# Patient Record
Sex: Female | Born: 1955 | Hispanic: No | Smoking: Former smoker
Health system: Southern US, Community
[De-identification: ages and names within clinical notes are randomized; demographics above are authoritative.]

## PROBLEM LIST (undated history)

## (undated) DIAGNOSIS — I4891 Unspecified atrial fibrillation: Secondary | ICD-10-CM

## (undated) DIAGNOSIS — E119 Type 2 diabetes mellitus without complications: Secondary | ICD-10-CM

## (undated) DIAGNOSIS — I509 Heart failure, unspecified: Secondary | ICD-10-CM

## (undated) DIAGNOSIS — I10 Essential (primary) hypertension: Secondary | ICD-10-CM

## (undated) DIAGNOSIS — E785 Hyperlipidemia, unspecified: Secondary | ICD-10-CM

## (undated) DIAGNOSIS — E669 Obesity, unspecified: Secondary | ICD-10-CM

## (undated) DIAGNOSIS — Z86718 Personal history of other venous thrombosis and embolism: Secondary | ICD-10-CM

## (undated) HISTORY — DX: Obesity, unspecified: E66.9

## (undated) HISTORY — DX: Unspecified atrial fibrillation: I48.91

## (undated) HISTORY — DX: Hyperlipidemia, unspecified: E78.5

## (undated) HISTORY — PX: TONSILLECTOMY: SUR1361

## (undated) HISTORY — DX: Essential (primary) hypertension: I10

## (undated) HISTORY — DX: Type 2 diabetes mellitus without complications: E11.9

## (undated) HISTORY — DX: Heart failure, unspecified: I50.9

## (undated) HISTORY — DX: Personal history of other venous thrombosis and embolism: Z86.718

---

## 1977-04-20 HISTORY — PX: APPENDECTOMY: SHX54

## 2001-05-27 ENCOUNTER — Encounter: Payer: Self-pay | Admitting: *Deleted

## 2001-05-27 ENCOUNTER — Ambulatory Visit (HOSPITAL_COMMUNITY): Admission: RE | Admit: 2001-05-27 | Discharge: 2001-05-27 | Payer: Self-pay | Admitting: *Deleted

## 2003-04-21 HISTORY — PX: HERNIA REPAIR: SHX51

## 2005-04-20 DIAGNOSIS — Z86718 Personal history of other venous thrombosis and embolism: Secondary | ICD-10-CM

## 2005-04-20 HISTORY — DX: Personal history of other venous thrombosis and embolism: Z86.718

## 2006-04-20 HISTORY — PX: CARDIAC DEFIBRILLATOR PLACEMENT: SHX171

## 2010-05-06 ENCOUNTER — Ambulatory Visit (HOSPITAL_COMMUNITY)
Admission: RE | Admit: 2010-05-06 | Discharge: 2010-05-06 | Payer: Self-pay | Source: Home / Self Care | Attending: Family Medicine | Admitting: Family Medicine

## 2010-07-07 ENCOUNTER — Ambulatory Visit (HOSPITAL_COMMUNITY)
Admission: RE | Admit: 2010-07-07 | Discharge: 2010-07-07 | Disposition: A | Discharge status: Home / Self Care | Payer: Self-pay | Source: Ambulatory Visit | Attending: Family Medicine | Admitting: Family Medicine

## 2010-07-07 ENCOUNTER — Other Ambulatory Visit (HOSPITAL_COMMUNITY): Payer: Self-pay | Admitting: Family Medicine

## 2010-07-07 DIAGNOSIS — R109 Unspecified abdominal pain: Secondary | ICD-10-CM | POA: Insufficient documentation

## 2010-07-07 DIAGNOSIS — R52 Pain, unspecified: Secondary | ICD-10-CM

## 2010-07-07 DIAGNOSIS — K5900 Constipation, unspecified: Secondary | ICD-10-CM | POA: Insufficient documentation

## 2010-07-07 DIAGNOSIS — R110 Nausea: Secondary | ICD-10-CM | POA: Insufficient documentation

## 2010-07-17 ENCOUNTER — Encounter (INDEPENDENT_AMBULATORY_CARE_PROVIDER_SITE_OTHER): Payer: Self-pay | Admitting: *Deleted

## 2010-08-26 ENCOUNTER — Encounter: Payer: Self-pay | Admitting: Gastroenterology

## 2010-08-26 ENCOUNTER — Ambulatory Visit (INDEPENDENT_AMBULATORY_CARE_PROVIDER_SITE_OTHER): Payer: Self-pay | Admitting: Gastroenterology

## 2010-08-26 ENCOUNTER — Encounter: Payer: Self-pay | Admitting: *Deleted

## 2010-08-26 DIAGNOSIS — E119 Type 2 diabetes mellitus without complications: Secondary | ICD-10-CM

## 2010-08-26 DIAGNOSIS — R198 Other specified symptoms and signs involving the digestive system and abdomen: Secondary | ICD-10-CM

## 2010-08-26 DIAGNOSIS — R109 Unspecified abdominal pain: Secondary | ICD-10-CM

## 2010-08-26 MED ORDER — PEG-KCL-NACL-NASULF-NA ASC-C 100 G PO SOLR: 1.0000 | Freq: Once | ORAL | Status: DC

## 2010-08-26 MED ORDER — PEG-KCL-NACL-NASULF-NA ASC-C 100 G PO SOLR: 1.0000 | Freq: Once | ORAL | Status: AC

## 2010-08-26 NOTE — Progress Notes (Signed)
History of Present Illness:  This is a very nice but somewhat complicated 55 year old African American female referred by Dr. Clyde Canterbury. Wilson for evaluation of 8 weeks of rather acute onset constipation with vague lower abdominal discomfort, gas, bloating, excessive flatus. Lab data has been unremarkable as has recent abdominal ultrasound. Patient today is accompanied by Dr. Redmond Pulling during the exam and interview. She specifically denies melena, hematochezia, upper GI or hepatobiliary complaints. He has insulin-dependent diabetes for 15 years with recent hemoglobin A1c of approximately 9. She hs multiple cardiovascular issues complicated by what sounds like a DVT and possible pulmonary embolus in 2007. Subsequently. she had cardiac arrhythmias, and apparently has had a  defibrillator placed. She currently is on Coumadin therapy. Other problems include hypertension and hyperlipidemia. She has chronic obesity but has lost over 100 pounds in weight over the last year voluntarily. Previous surgeries include appendectomy in 1979. She has had 3 cesarean sections. She is on split doses of insulin, daily metformin, digoxin, Coreg, and Lipitor. Family history is noncontributory. The patient also takes daily fiber supplements. She denies complications from her diabetes such as visual difficulties, peripheral neuropathy, or kidney problems. She Is Followed by [facility] for her cardiovascular problems. Currently the patient has a bowel movement every 3 days despite laxative use.  I have reviewed this patient's present history, medical and surgical past history, allergies and medications.     ROS: The remainder of the 10 point ROS is negative     Physical Exam: General well developed well nourished patient in no acute distress, appearing her stated age Eyes PERRLA, no icterus, fundoscopic exam per opthamologist Skin no lesions noted Neck supple, no adenopathy, no thyroid enlargement, no tenderness Chest  clear to percussion and auscultation Heart no significant murmurs, gallops or rubs noted Abdomen no hepatosplenomegaly masses or tenderness, BS normal. Veryloud and active bowel sounds are noted. She has a well healed suprapubic scar with a 8-9 cm mid abdominal mass which is freely movable in the midabdomen. This is nontender and not fluctuant. Extremities no acute joint lesions, edema, phlebitis or evidence of cellulitis. Neurologic patient oriented x 3, cranial nerves intact, no focal neurologic deficits noted. Psychological mental status normal and normal affect.  Assessment and plan: Acute onset constipation and a 55 year old female with insulin-dependent diabetes. She has an abnormal abdominal exam, suspect she has some adhesions with palpable bowel loops in the lower abdomen, but colonoscopy is indicated to exclude colonic polyposis or partial colonic obstruction. Decided to perform this procedure on Coumadin with close monitoring/sedation. She may have an element of bacterial overgrowth syndrome. Screening laboratory test also ordered at this time. We'll make adjustments in her diabetic medications for procedure.  Encounter Diagnoses  Name Primary?  . Insulin dependent diabetes mellitus   . Change in bowel function   . Abdominal pain

## 2010-08-26 NOTE — Patient Instructions (Signed)
Your procedure has been scheduled for 08/27/2010, please follow the seperate instructions.  Your prescription(s) have been sent to you pharmacy.

## 2010-08-27 ENCOUNTER — Encounter: Payer: Self-pay | Admitting: Gastroenterology

## 2010-08-27 ENCOUNTER — Ambulatory Visit (AMBULATORY_SURGERY_CENTER): Payer: Self-pay | Admitting: Gastroenterology

## 2010-08-27 VITALS — BP 135/71 | HR 65 | Temp 97.4°F | Resp 19 | Ht 64.0 in | Wt 264.0 lb

## 2010-08-27 DIAGNOSIS — K5900 Constipation, unspecified: Secondary | ICD-10-CM

## 2010-08-27 DIAGNOSIS — R1084 Generalized abdominal pain: Secondary | ICD-10-CM

## 2010-08-27 DIAGNOSIS — R109 Unspecified abdominal pain: Secondary | ICD-10-CM

## 2010-08-27 DIAGNOSIS — K5904 Chronic idiopathic constipation: Secondary | ICD-10-CM

## 2010-08-27 LAB — GLUCOSE, CAPILLARY: Glucose-Capillary: 220 mg/dL — ABNORMAL HIGH (ref 70–99)

## 2010-08-27 MED ORDER — SODIUM CHLORIDE 0.9 % IV SOLN: 500.0000 mL | INTRAVENOUS | Status: DC

## 2010-08-27 NOTE — Patient Instructions (Signed)
Normal colonoscopy today. Repeat colonoscopy in 10 years. Please read over discharge handouts also handout given on high fiber diet.  Continue current medications. Blood sugar at discharge=220. Start Miralax over the counter laxative 1 tablespoon nightly in 8oz liquid at directed.  Samples given today for Align take 1 tablet daily.

## 2010-08-28 ENCOUNTER — Telehealth: Payer: Self-pay | Admitting: *Deleted

## 2010-08-28 NOTE — Telephone Encounter (Signed)

## 2010-09-05 NOTE — Cardiovascular Report (Signed)
[place]  Patient:    Tonya Evans, Tonya Evans Visit Number: [id number] MRN: [id number]          Service Type: CAT Location: [facility] 2854 [id number] Attending Physician:  Allene Dillon Dictated by:   Allene Dillon, M.D. [facility] Proc. Date: 05/27/01 Admit Date:  05/27/2001 Discharge Date: 05/27/2001   CC:         Franklyn Lor, M.D.  Carlena Bjornstad, M.D. [facility]  Cardiac Catheterization Laboratory   Cardiac Catheterization  PROCEDURES PERFORMED: Left heart catheterization with coronary angiography and left ventriculography.  INDICATIONS: The patient is a 55 year old woman with a history of diabetes and hypertension. She had a recent episode of congestive heart failure. She was referred for cardiac catheterization to evaluate the etiology of her congestive heart failure.  DESCRIPTION OF PROCEDURE: A 6 French sheath was placed in the right femoral artery.  Standard Judkins 6 French catheters were utilized.  Contrast was Omnipaque.  There were no complications.  RESULTS:  HEMODYNAMICS: Left ventricular pressure 150/30, aortic pressure 150/78.  There was no aortic valve gradient.  LEFT VENTRICULOGRAM: There is moderate global hypokinesis of the left ventricle.  Ejection fraction is calculated at 42%.  There is trace mitral regurgitation.  CORONARY ARTERIOGRAPHY: (Right dominant).  Left main: Normal.  Left anterior descending: The left anterior descending artery gives rise to a small diagonal branch. The LAD is normal.  Left circumflex: The left circumflex gives rise to a large OM-1 and a normal sized OM-2. The left circumflex is normal.  Right coronary artery: The right coronary artery gives rise to a small posterior descending artery and two posterolateral branches. The right coronary artery is normal.  IMPRESSIONS: 1. Moderately decreased left ventricular systolic function secondary to a    nonischemic cardiomyopathy. 2. Normal coronary arteries by  angiography. Dictated by:   Allene Dillon, M.D. [place] Attending Physician:  Allene Dillon DD:  05/27/01 TD:  05/28/01 Job: [id number]

## 2010-10-13 NOTE — Letter (Signed)
Summary: New Patient letter  [facility]  [address]   [place], [place] [address]   Phone: [phone]  Fax: [phone]       07/17/2010 MRN: [id number]  [address] [place], [place]  [address]  [place]  Dear Ms. [place],  Welcome to the Gastroenterology Division at [facility].    You are scheduled to see Dr.  Sharlett Iles on 08/26/2010 at 10:00 on the 3rd floor at [facility], [place] [employer].  We ask that you try to arrive at our office 15 minutes prior to your appointment time to allow for check-in.  We would like you to complete the enclosed self-administered evaluation form prior to your visit and bring it with you on the day of your appointment.  We will review it with you.  Also, please bring a complete list of all your medications or, if you prefer, bring the medication bottles and we will list them.  Please bring your insurance card so that we may make a copy of it.  If your insurance requires a referral to see a specialist, please bring your referral form from your primary care physician.  Co-payments are due at the time of your visit and may be paid by cash, check or credit card.     Your office visit will consist of a consult with your physician (includes a physical exam), any laboratory testing he/she may order, scheduling of any necessary diagnostic testing (e.g. x-ray, ultrasound, CT-scan), and scheduling of a procedure (e.g. Endoscopy, Colonoscopy) if required.  Please allow enough time on your schedule to allow for any/all of these possibilities.    If you cannot keep your appointment, please call [phone] to cancel or reschedule prior to your appointment date.  This allows [place] the opportunity to schedule an appointment for another patient in need of care.  If you do not cancel or reschedule by 5 p.m. the business day prior to your appointment date, you will be charged a $50.00 late cancellation/no-show fee.    Thank you for choosing  [facility] for your medical needs.  We appreciate the opportunity to care for you.  Please visit [place] at our website  to learn more about our practice.                     Sincerely,                                                             The Gastroenterology Division

## 2011-01-29 ENCOUNTER — Other Ambulatory Visit (HOSPITAL_COMMUNITY): Payer: Self-pay | Admitting: Family Medicine

## 2011-01-29 ENCOUNTER — Ambulatory Visit (HOSPITAL_COMMUNITY)
Admission: RE | Admit: 2011-01-29 | Discharge: 2011-01-29 | Disposition: A | Discharge status: Home / Self Care | Payer: Self-pay | Source: Ambulatory Visit | Attending: Family Medicine | Admitting: Family Medicine

## 2011-01-29 DIAGNOSIS — M25473 Effusion, unspecified ankle: Secondary | ICD-10-CM | POA: Insufficient documentation

## 2011-01-29 DIAGNOSIS — M25476 Effusion, unspecified foot: Secondary | ICD-10-CM | POA: Insufficient documentation

## 2011-01-29 DIAGNOSIS — M79609 Pain in unspecified limb: Secondary | ICD-10-CM | POA: Insufficient documentation

## 2011-01-29 DIAGNOSIS — M1710 Unilateral primary osteoarthritis, unspecified knee: Secondary | ICD-10-CM | POA: Insufficient documentation

## 2011-01-29 DIAGNOSIS — M25579 Pain in unspecified ankle and joints of unspecified foot: Secondary | ICD-10-CM | POA: Insufficient documentation

## 2011-01-29 DIAGNOSIS — W19XXXA Unspecified fall, initial encounter: Secondary | ICD-10-CM

## 2011-01-29 DIAGNOSIS — M25539 Pain in unspecified wrist: Secondary | ICD-10-CM | POA: Insufficient documentation

## 2012-11-18 IMAGING — CR DG FOOT COMPLETE 3+V*R*
3 series · 3 of 3 positions shown · non-contrast
Comparison: None.

CLINICAL DATA: Right foot and ankle pain and swelling secondary to
a fall.

RIGHT FOOT COMPLETE - 3+ VIEW

[t foot ap right]
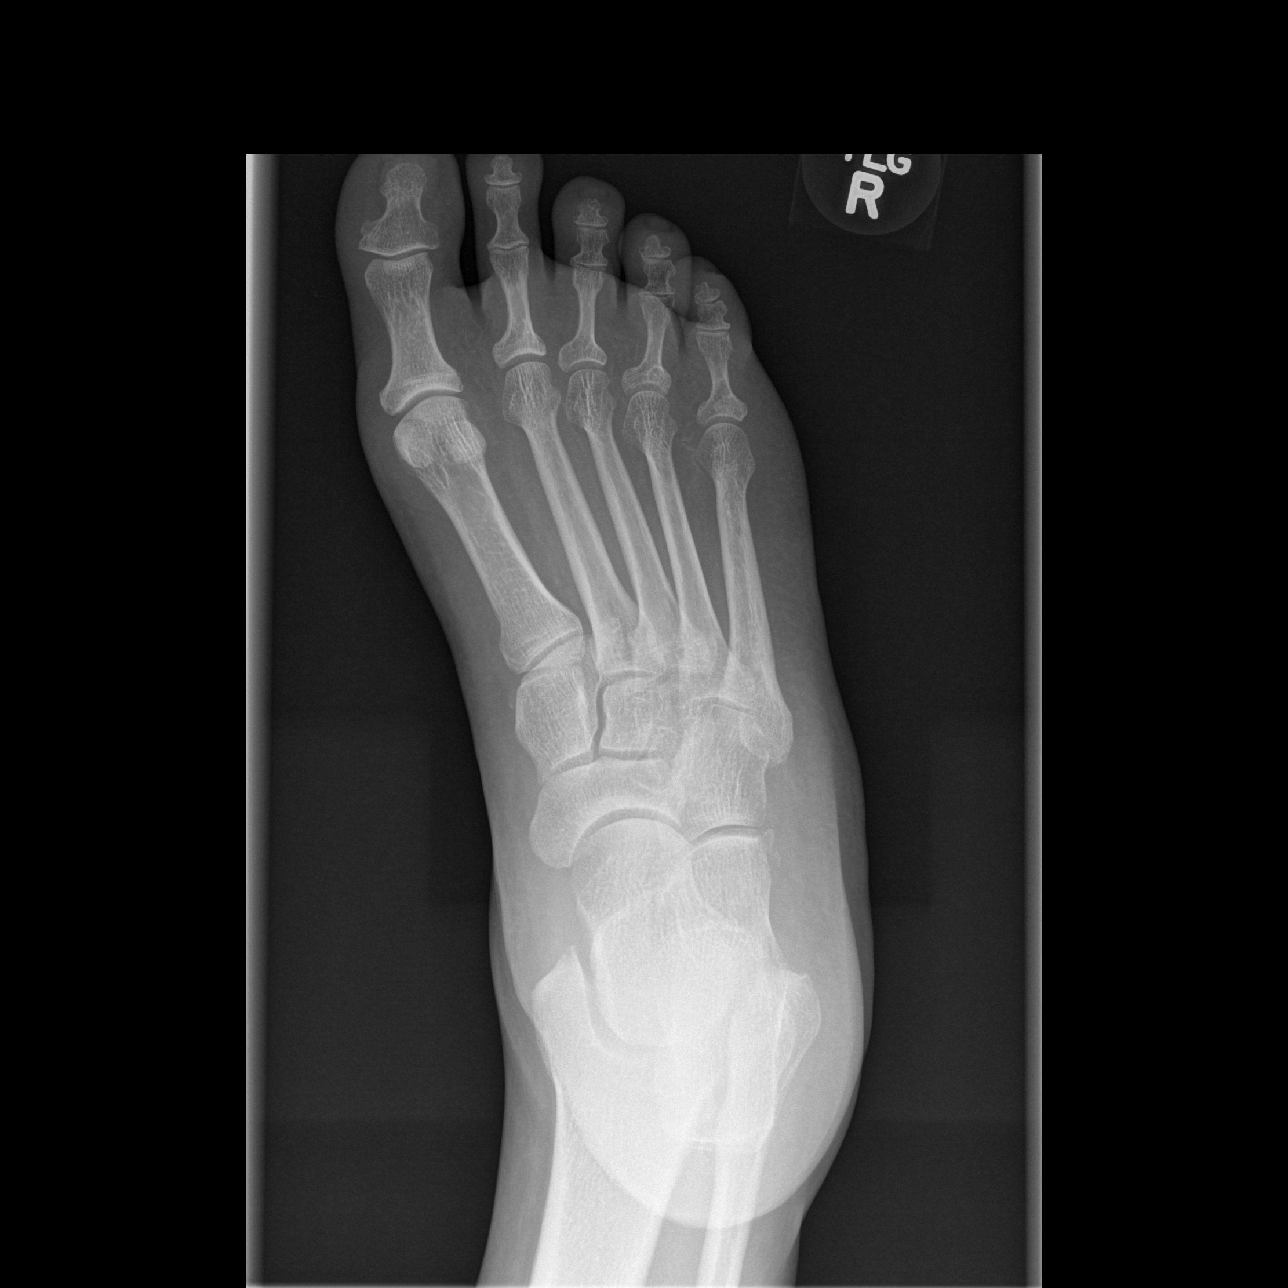

[t foot oblique right]
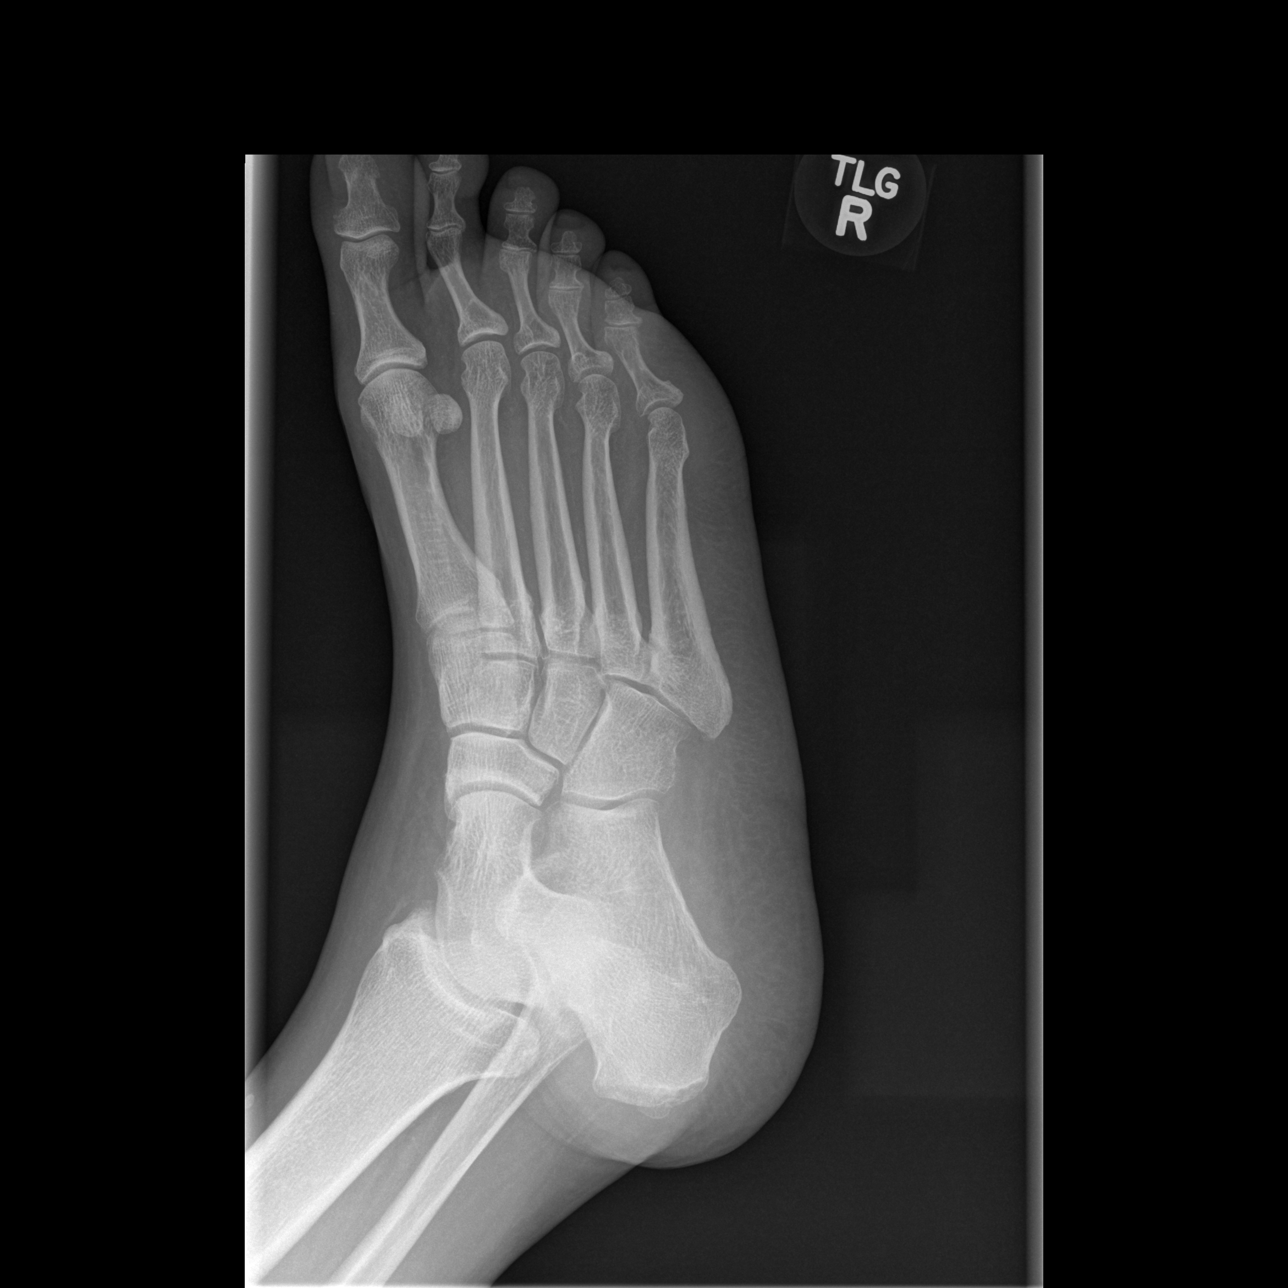

[t foot lat right]
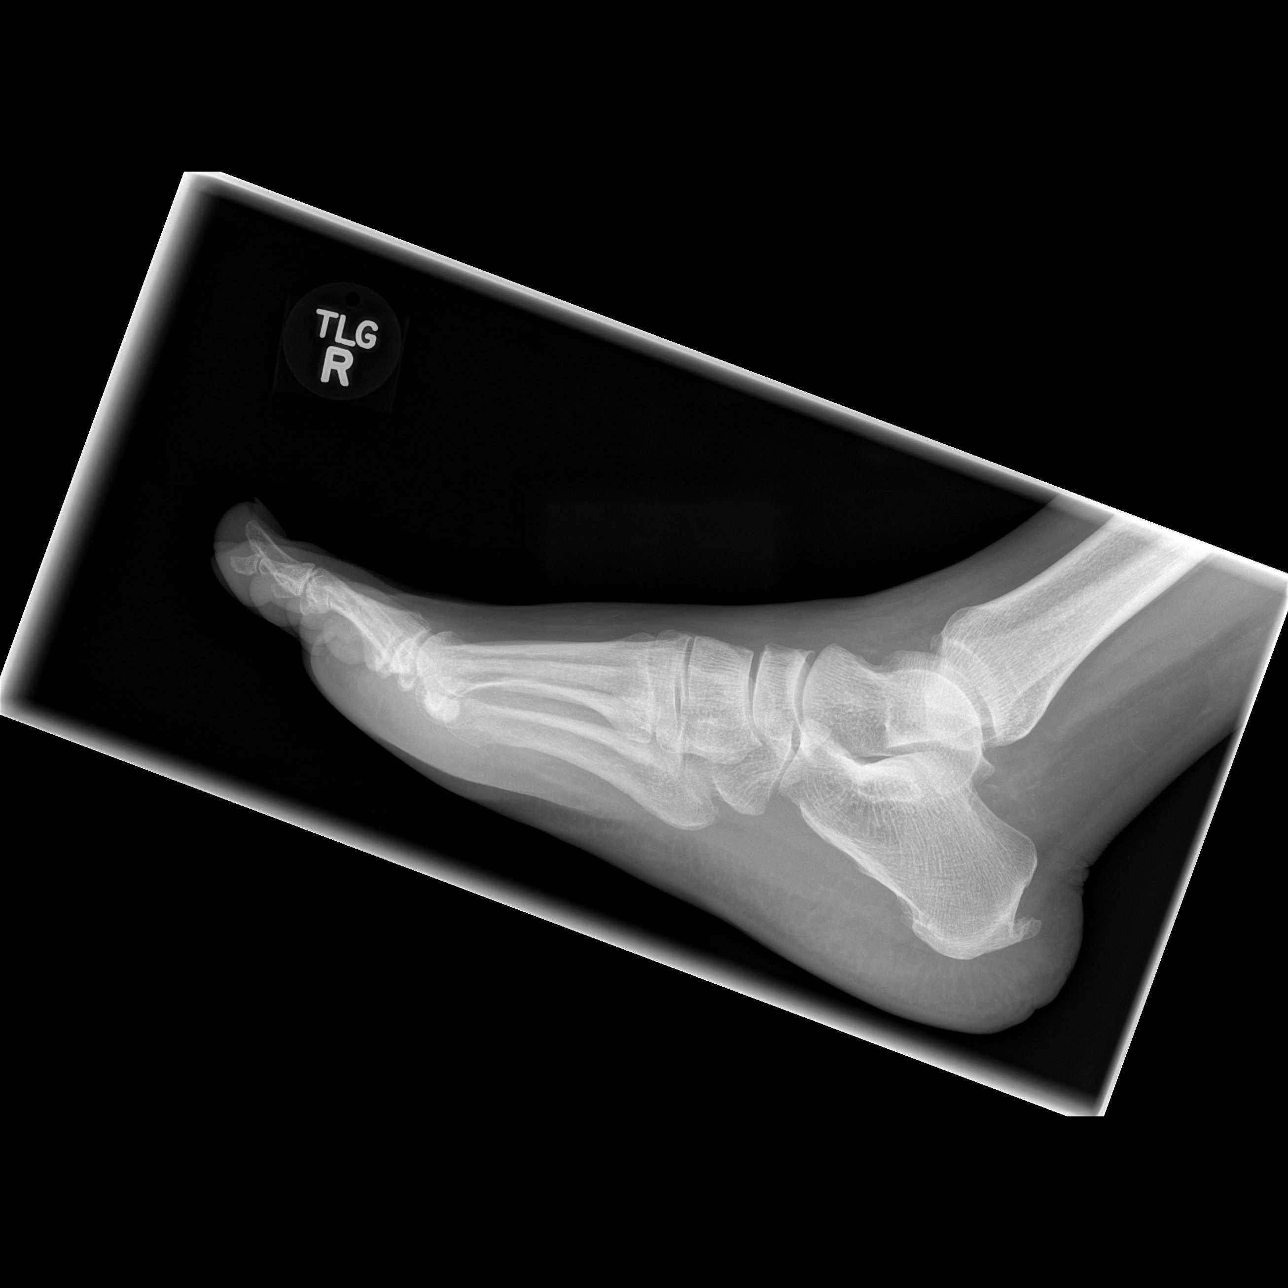

[3 of 3 positions shown; findings below may reference images not displayed]

FINDINGS: There is no fracture, dislocation, or significant
arthritic change.
IMPRESSION: Normal right foot.

## 2013-09-06 DIAGNOSIS — E669 Obesity, unspecified: Secondary | ICD-10-CM | POA: Insufficient documentation

## 2013-09-06 DIAGNOSIS — Z95810 Presence of automatic (implantable) cardiac defibrillator: Secondary | ICD-10-CM | POA: Insufficient documentation

## 2014-05-22 ENCOUNTER — Ambulatory Visit: Payer: Self-pay

## 2014-05-25 DIAGNOSIS — Z789 Other specified health status: Secondary | ICD-10-CM | POA: Insufficient documentation

## 2014-05-25 DIAGNOSIS — I824Y9 Acute embolism and thrombosis of unspecified deep veins of unspecified proximal lower extremity: Secondary | ICD-10-CM | POA: Insufficient documentation

## 2014-05-29 ENCOUNTER — Ambulatory Visit: Payer: Self-pay

## 2015-02-04 DIAGNOSIS — I429 Cardiomyopathy, unspecified: Secondary | ICD-10-CM | POA: Insufficient documentation

## 2017-09-08 DIAGNOSIS — R0689 Other abnormalities of breathing: Secondary | ICD-10-CM | POA: Insufficient documentation

## 2017-09-08 DIAGNOSIS — Z7901 Long term (current) use of anticoagulants: Secondary | ICD-10-CM | POA: Insufficient documentation

## 2017-09-08 DIAGNOSIS — R0600 Dyspnea, unspecified: Secondary | ICD-10-CM | POA: Insufficient documentation

## 2017-09-08 DIAGNOSIS — I509 Heart failure, unspecified: Secondary | ICD-10-CM | POA: Insufficient documentation

## 2017-09-08 DIAGNOSIS — I480 Paroxysmal atrial fibrillation: Secondary | ICD-10-CM | POA: Insufficient documentation

## 2017-09-08 DIAGNOSIS — E1165 Type 2 diabetes mellitus with hyperglycemia: Secondary | ICD-10-CM | POA: Insufficient documentation

## 2017-09-08 DIAGNOSIS — I10 Essential (primary) hypertension: Secondary | ICD-10-CM | POA: Insufficient documentation

## 2017-09-08 DIAGNOSIS — J4530 Mild persistent asthma, uncomplicated: Secondary | ICD-10-CM | POA: Insufficient documentation

## 2017-09-08 DIAGNOSIS — J45909 Unspecified asthma, uncomplicated: Secondary | ICD-10-CM | POA: Insufficient documentation

## 2017-12-29 DIAGNOSIS — R42 Dizziness and giddiness: Secondary | ICD-10-CM | POA: Insufficient documentation

## 2017-12-31 DIAGNOSIS — H8110 Benign paroxysmal vertigo, unspecified ear: Secondary | ICD-10-CM | POA: Insufficient documentation

## 2018-06-14 DIAGNOSIS — Z86718 Personal history of other venous thrombosis and embolism: Secondary | ICD-10-CM | POA: Insufficient documentation

## 2019-03-03 DIAGNOSIS — R778 Other specified abnormalities of plasma proteins: Secondary | ICD-10-CM | POA: Insufficient documentation

## 2019-03-03 DIAGNOSIS — E785 Hyperlipidemia, unspecified: Secondary | ICD-10-CM | POA: Insufficient documentation

## 2020-03-07 DIAGNOSIS — I2510 Atherosclerotic heart disease of native coronary artery without angina pectoris: Secondary | ICD-10-CM | POA: Insufficient documentation

## 2020-12-09 ENCOUNTER — Encounter: Payer: Self-pay | Admitting: Gastroenterology

## 2020-12-20 ENCOUNTER — Encounter: Payer: Self-pay | Admitting: *Deleted

## 2020-12-20 ENCOUNTER — Encounter: Payer: Self-pay | Admitting: Family Medicine

## 2020-12-20 ENCOUNTER — Other Ambulatory Visit: Payer: Self-pay

## 2020-12-20 ENCOUNTER — Ambulatory Visit (INDEPENDENT_AMBULATORY_CARE_PROVIDER_SITE_OTHER): Payer: HMO | Admitting: Family Medicine

## 2020-12-20 VITALS — BP 109/70 | HR 90 | Temp 97.1°F | Resp 16 | Ht 65.5 in | Wt 286.0 lb

## 2020-12-20 DIAGNOSIS — Z7689 Persons encountering health services in other specified circumstances: Secondary | ICD-10-CM

## 2020-12-20 DIAGNOSIS — I10 Essential (primary) hypertension: Secondary | ICD-10-CM | POA: Diagnosis not present

## 2020-12-20 DIAGNOSIS — I4891 Unspecified atrial fibrillation: Secondary | ICD-10-CM | POA: Insufficient documentation

## 2020-12-20 DIAGNOSIS — E1169 Type 2 diabetes mellitus with other specified complication: Secondary | ICD-10-CM

## 2020-12-20 DIAGNOSIS — Z6841 Body Mass Index (BMI) 40.0 and over, adult: Secondary | ICD-10-CM

## 2020-12-20 LAB — POCT GLYCOSYLATED HEMOGLOBIN (HGB A1C): Hemoglobin A1C: 9 % — AB (ref 4.0–5.6)

## 2020-12-20 MED ORDER — FREESTYLE LIBRE 2 SENSOR MISC: 3 refills | Status: DC

## 2020-12-20 NOTE — Progress Notes (Signed)
New Patient Office Visit  Subjective:  Patient ID: Tonya Evans, female    DOB: 04/30/1955  Age: 65 y.o. MRN: [id number]  CC:  Chief Complaint  Patient presents with   Establish Care    HPI [name] presents for to establish care.  Patient has had to change provider location secondary to insurance.  Patient desires to follow-up for chronic medical problems including hypertension diabetes and obesity.  Patient denies acute complaints or concerns.  Past Medical History:  Diagnosis Date   A-fib ([place])    CHF (congestive heart failure) (HCC)    Constipation    History of blood clots 04/20/2005   Hyperlipemia    Hypertension    Obesity    Type II or unspecified type diabetes mellitus without mention of complication, not stated as uncontrolled     Past Surgical History:  Procedure Laterality Date   APPENDECTOMY  1979   CARDIAC DEFIBRILLATOR PLACEMENT  2008   CESAREAN SECTION     x 3    HERNIA REPAIR  2005   TONSILLECTOMY      Family History  Problem Relation Age of Onset   Diabetes Father    Hypertension Mother    Diabetes Brother    Hypertension Sister    Breast cancer Mother    Colon cancer Neg Hx     Social History   Socioeconomic History   Marital status: Legally Separated    Spouse name: Not on file   Number of children: Not on file   Years of education: Not on file   Highest education level: Not on file  Occupational History   Occupation: pastor    Employer: [employer]  Tobacco Use   Smoking status: Former   Smokeless tobacco: Never  Substance and Sexual Activity   Alcohol use: No   Drug use: No   Sexual activity: Not on file  Other Topics Concern   Not on file  Social History Narrative   No caffeine drinks    Social Determinants of Radio broadcast assistant Strain: Not on file  Food Insecurity: Not on file  Transportation Needs: Not on file  Physical Activity: Not on file  Stress: Not on file  Social Connections: Not on file   Intimate Partner Violence: Not on file    ROS Review of Systems  All other systems reviewed and are negative.  Objective:   Today's Vitals: BP 109/70 (BP Location: Right Arm, Patient Position: Sitting, Cuff Size: Large)   Pulse 90   Temp (!) 97.1 F (36.2 C) (Temporal)   Resp 16   Ht 5' 5.5" (1.664 m)   Wt 286 lb (129.7 kg)   SpO2 96%   BMI 46.87 kg/m   Physical Exam Vitals and nursing note reviewed.  Constitutional:      General: She is not in acute distress.    Appearance: She is obese.  Cardiovascular:     Rate and Rhythm: Normal rate and regular rhythm.     Heart sounds: Normal heart sounds.  Pulmonary:     Effort: Pulmonary effort is normal.     Breath sounds: Normal breath sounds.  Abdominal:     Palpations: Abdomen is soft.     Tenderness: There is no abdominal tenderness.  Musculoskeletal:     Right lower leg: Edema present.     Left lower leg: Edema present.  Neurological:     General: No focal deficit present.     Mental Status:  She is alert and oriented to person, place, and time.  Psychiatric:        Mood and Affect: Mood normal.        Behavior: Behavior normal.    Assessment & Plan:   1. Type 2 diabetes mellitus with other specified complication, unspecified whether long term insulin use ([name]) Patient with elevated hemoglobin A1c.  Will prescribe freestyle libre sensor to allow patient to monitor blood sugars more efficiently  - POCT glycosylated hemoglobin (Hb A1C) - Continuous Blood Gluc Sensor (FREESTYLE LIBRE 2 SENSOR) MISC; Place as directed Q14 days to read blood glucose  Dispense: 2 each; Refill: 3  2. Essential hypertension Continue present management.  3. Class 3 severe obesity due to excess calories with serious comorbidity and body mass index (BMI) of 45.0 to 49.9 in adult [facility]) Discussed dietary and activity options.  Patient goal is 3 to 5 pounds per month weight loss.  4. Encounter to establish care      Outpatient  Encounter Medications as of 12/20/2020  Medication Sig   albuterol (PROVENTIL) (2.5 MG/3ML) 0.083% nebulizer solution Inhale into the lungs.   albuterol (VENTOLIN HFA) 108 (90 Base) MCG/ACT inhaler INHALE 2 PUFFS BY MOUTH EVERY 6 HOURS AS NEEDED FOR COUGHING, WHEEZING, OR SHORTNESS OF BREATH   atorvastatin (LIPITOR) 20 MG tablet Take 1 tablet by mouth daily.   budesonide (PULMICORT) 0.5 MG/2ML nebulizer solution Inhale into the lungs.   canagliflozin (INVOKANA) 100 MG TABS tablet TAKE 1 Tablet BY MOUTH ONCE EVERY DAY   cetirizine (ZYRTEC) 10 MG tablet Take 1 tablet by mouth daily.   clonazePAM (KLONOPIN) 0.5 MG tablet Take by mouth as needed.   gabapentin (NEURONTIN) 600 MG tablet Take by mouth.   Insulin Syringe-Needle U-100 (SURE COMFORT INSULIN SYRINGE) 31G X 1/4" 0.3 ML MISC Utilize TID AC  As needed with regular insulin to sliding scale   isosorbide mononitrate (IMDUR) 30 MG 24 hr tablet Take 1 tablet by mouth daily.   losartan (COZAAR) 100 MG tablet Take 1 tablet by mouth daily.   mometasone-formoterol (DULERA) 200-5 MCG/ACT AERO INHALE 2 PUFFS BY MOUTH TWICE DAILY. RINSE MOUTH AFTER EACH USE   omeprazole (PRILOSEC) 40 MG capsule Take 1 capsule by mouth daily.   prasugrel (EFFIENT) 10 MG TABS tablet Take 1 tablet by mouth daily.   albuterol (PROVENTIL) (2.5 MG/3ML) 0.083% nebulizer solution Take 2.5 mg by nebulization every 4 (four) hours as needed.   albuterol (VENTOLIN HFA) 108 (90 Base) MCG/ACT inhaler SMARTSIG:2 Puff(s) By Mouth Every 4 Hours PRN   atorvastatin (LIPITOR) 20 MG tablet Take 20 mg by mouth daily.     budesonide (PULMICORT) 0.5 MG/2ML nebulizer solution Take by nebulization daily.   carvedilol (COREG) 6.25 MG tablet Take 6.25 mg by mouth. 1 1/2 by mouth twice daily    digoxin (LANOXIN) 0.25 MG tablet Take by mouth daily.     Docusate Calcium (STOOL SOFTENER PO) Take by mouth. As needed    furosemide (LASIX) 40 MG tablet Take 40 mg by mouth daily.     glipiZIDE (GLUCOTROL)  10 MG tablet Take 10 mg by mouth 2 (two) times daily before a meal.     Insulin Aspart (NOVOLOG [place]) Inject into the skin. 5 units three times daily with meals    Insulin Glargine (LANTUS [place]) Inject into the skin. 55 units at bedtime    liraglutide (VICTOZA) 18 MG/3ML SOPN Inject 1.8 mg into the skin daily.   metFORMIN (GLUCOPHAGE) 1000 MG tablet Take  1,000 mg by mouth 2 (two) times daily with a meal.     potassium chloride SA (K-DUR,KLOR-CON) 20 MEQ tablet Take 20 mEq by mouth daily.     SPIRONOLACTONE PO Take 25 mg by mouth.    valsartan (DIOVAN) 320 MG tablet Take 320 mg by mouth daily.     warfarin (COUMADIN) 10 MG tablet Take 10 mg by mouth daily.     Facility-Administered Encounter Medications as of 12/20/2020  Medication   0.9 %  sodium chloride infusion    Follow-up: Return in about 3 months (around 03/21/2021) for follow up, chronic med issues.   Becky Sax, MD

## 2020-12-20 NOTE — Progress Notes (Signed)
Patient is here to est care. Patient would to talk about her medication. Patient would like to get a free style librea

## 2021-01-27 ENCOUNTER — Other Ambulatory Visit: Payer: Self-pay | Admitting: Family Medicine

## 2021-01-28 DIAGNOSIS — E1122 Type 2 diabetes mellitus with diabetic chronic kidney disease: Secondary | ICD-10-CM | POA: Insufficient documentation

## 2021-01-28 DIAGNOSIS — E559 Vitamin D deficiency, unspecified: Secondary | ICD-10-CM | POA: Insufficient documentation

## 2021-01-28 DIAGNOSIS — I129 Hypertensive chronic kidney disease with stage 1 through stage 4 chronic kidney disease, or unspecified chronic kidney disease: Secondary | ICD-10-CM | POA: Insufficient documentation

## 2021-01-28 DIAGNOSIS — N184 Chronic kidney disease, stage 4 (severe): Secondary | ICD-10-CM | POA: Insufficient documentation

## 2021-01-28 DIAGNOSIS — N183 Chronic kidney disease, stage 3 unspecified: Secondary | ICD-10-CM | POA: Insufficient documentation

## 2021-01-28 DIAGNOSIS — M898X9 Other specified disorders of bone, unspecified site: Secondary | ICD-10-CM | POA: Insufficient documentation

## 2021-02-03 ENCOUNTER — Other Ambulatory Visit: Payer: Self-pay | Admitting: Family Medicine

## 2021-02-03 MED ORDER — WARFARIN SODIUM 2 MG PO TABS: 2.0000 mg | ORAL_TABLET | Freq: Every day | ORAL | 1 refills | Status: DC

## 2021-02-03 MED ORDER — ISOSORBIDE MONONITRATE ER 30 MG PO TB24: 30.0000 mg | ORAL_TABLET | Freq: Every day | ORAL | 1 refills | Status: DC

## 2021-02-03 MED ORDER — CARVEDILOL 25 MG PO TABS: 25.0000 mg | ORAL_TABLET | Freq: Two times a day (BID) | ORAL | 1 refills | Status: DC

## 2021-02-03 MED ORDER — CETIRIZINE HCL 10 MG PO TABS: 10.0000 mg | ORAL_TABLET | Freq: Every day | ORAL | 1 refills | Status: DC

## 2021-02-03 MED ORDER — WARFARIN SODIUM 5 MG PO TABS: 5.0000 mg | ORAL_TABLET | Freq: Every day | ORAL | 1 refills | Status: DC

## 2021-02-03 MED ORDER — DAPAGLIFLOZIN PROPANEDIOL 10 MG PO TABS
10.0000 mg | ORAL_TABLET | Freq: Every day | ORAL | 0 refills | Status: DC
Start: 2021-02-03 — End: 2021-04-22

## 2021-02-07 ENCOUNTER — Other Ambulatory Visit: Payer: Self-pay | Admitting: Family Medicine

## 2021-02-07 DIAGNOSIS — E1169 Type 2 diabetes mellitus with other specified complication: Secondary | ICD-10-CM

## 2021-02-07 MED ORDER — INSULIN GLARGINE 100 UNIT/ML ~~LOC~~ SOLN: 50.0000 [IU] | Freq: Two times a day (BID) | SUBCUTANEOUS | 3 refills | Status: DC

## 2021-02-10 ENCOUNTER — Other Ambulatory Visit: Payer: Self-pay | Admitting: Family Medicine

## 2021-02-10 MED ORDER — EZETIMIBE 10 MG PO TABS: 10.0000 mg | ORAL_TABLET | Freq: Every day | ORAL | 1 refills | Status: DC

## 2021-02-10 MED ORDER — HYDRALAZINE HCL 25 MG PO TABS: 25.0000 mg | ORAL_TABLET | Freq: Two times a day (BID) | ORAL | 0 refills | Status: DC

## 2021-02-12 ENCOUNTER — Other Ambulatory Visit: Payer: Self-pay | Admitting: Family Medicine

## 2021-02-12 DIAGNOSIS — E1169 Type 2 diabetes mellitus with other specified complication: Secondary | ICD-10-CM

## 2021-02-12 MED ORDER — INSULIN GLARGINE 100 UNIT/ML ~~LOC~~ SOLN: 50.0000 [IU] | Freq: Two times a day (BID) | SUBCUTANEOUS | 5 refills | Status: DC

## 2021-02-18 ENCOUNTER — Other Ambulatory Visit: Payer: Self-pay

## 2021-02-18 ENCOUNTER — Ambulatory Visit: Payer: HMO

## 2021-03-21 ENCOUNTER — Ambulatory Visit: Payer: HMO | Admitting: Family Medicine

## 2021-03-31 ENCOUNTER — Other Ambulatory Visit: Payer: Self-pay | Admitting: Family Medicine

## 2021-03-31 DIAGNOSIS — E1169 Type 2 diabetes mellitus with other specified complication: Secondary | ICD-10-CM

## 2021-03-31 MED ORDER — FREESTYLE LIBRE 2 SENSOR MISC: 3 refills | Status: DC

## 2021-03-31 MED ORDER — LIRAGLUTIDE 18 MG/3ML ~~LOC~~ SOPN: 1.8000 mg | PEN_INJECTOR | Freq: Every day | SUBCUTANEOUS | 3 refills | Status: DC

## 2021-04-05 DIAGNOSIS — I509 Heart failure, unspecified: Secondary | ICD-10-CM | POA: Insufficient documentation

## 2021-04-22 ENCOUNTER — Other Ambulatory Visit: Payer: Self-pay | Admitting: Family Medicine

## 2021-04-22 MED ORDER — ATORVASTATIN CALCIUM 20 MG PO TABS: 20.0000 mg | ORAL_TABLET | Freq: Every day | ORAL | 3 refills | Status: DC

## 2021-04-22 MED ORDER — DAPAGLIFLOZIN PROPANEDIOL 10 MG PO TABS
10.0000 mg | ORAL_TABLET | Freq: Every day | ORAL | 3 refills | Status: DC
Start: 2021-04-22 — End: 2022-01-29

## 2021-04-28 ENCOUNTER — Other Ambulatory Visit: Payer: Self-pay | Admitting: Family Medicine

## 2021-04-28 MED ORDER — SUCRALFATE 1 G PO TABS: 1.0000 g | ORAL_TABLET | Freq: Three times a day (TID) | ORAL | 0 refills | Status: DC

## 2021-05-21 ENCOUNTER — Other Ambulatory Visit: Payer: Self-pay | Admitting: *Deleted

## 2021-05-21 ENCOUNTER — Encounter: Payer: Self-pay | Admitting: *Deleted

## 2021-05-21 ENCOUNTER — Other Ambulatory Visit: Payer: Self-pay | Admitting: Family Medicine

## 2021-05-21 MED ORDER — LOSARTAN POTASSIUM 100 MG PO TABS: 100.0000 mg | ORAL_TABLET | Freq: Every day | ORAL | 1 refills | Status: DC

## 2021-05-21 MED ORDER — HYDRALAZINE HCL 25 MG PO TABS: 25.0000 mg | ORAL_TABLET | Freq: Two times a day (BID) | ORAL | 1 refills | Status: DC

## 2021-06-02 DIAGNOSIS — I952 Hypotension due to drugs: Secondary | ICD-10-CM | POA: Insufficient documentation

## 2021-06-20 ENCOUNTER — Other Ambulatory Visit: Payer: Self-pay | Admitting: Family Medicine

## 2021-06-20 MED ORDER — FLUCONAZOLE 150 MG PO TABS: 150.0000 mg | ORAL_TABLET | Freq: Once | ORAL | 0 refills | Status: AC

## 2021-07-02 ENCOUNTER — Encounter (INDEPENDENT_AMBULATORY_CARE_PROVIDER_SITE_OTHER): Payer: Self-pay

## 2021-07-02 ENCOUNTER — Encounter: Payer: Self-pay | Admitting: Family Medicine

## 2021-07-02 ENCOUNTER — Ambulatory Visit (INDEPENDENT_AMBULATORY_CARE_PROVIDER_SITE_OTHER): Payer: HMO | Admitting: Family Medicine

## 2021-07-02 ENCOUNTER — Other Ambulatory Visit: Payer: Self-pay

## 2021-07-02 VITALS — BP 116/74 | HR 80 | Temp 97.4°F | Resp 16 | Wt 276.0 lb

## 2021-07-02 DIAGNOSIS — E1169 Type 2 diabetes mellitus with other specified complication: Secondary | ICD-10-CM | POA: Diagnosis not present

## 2021-07-02 DIAGNOSIS — Z86718 Personal history of other venous thrombosis and embolism: Secondary | ICD-10-CM

## 2021-07-02 DIAGNOSIS — I10 Essential (primary) hypertension: Secondary | ICD-10-CM | POA: Diagnosis not present

## 2021-07-02 DIAGNOSIS — Z6841 Body Mass Index (BMI) 40.0 and over, adult: Secondary | ICD-10-CM | POA: Diagnosis not present

## 2021-07-02 DIAGNOSIS — E66813 Obesity, class 3: Secondary | ICD-10-CM

## 2021-07-02 LAB — POCT INR: INR: 7 — AB (ref 2.0–3.0)

## 2021-07-02 MED ORDER — PHYTONADIONE 5 MG PO TABS: 2.5000 mg | ORAL_TABLET | Freq: Once | ORAL | 0 refills | Status: AC

## 2021-07-02 NOTE — Progress Notes (Signed)
? ?Established Patient Office Visit ? ?Subjective:  ?Patient ID: Tonya Evans, female    DOB: [birth year]  Age: 66 y.o. MRN: [id number] ? ?CC:  ?Chief Complaint  ?Patient presents with  ? Follow-up  ? Diabetes  ? ? ?HPI ?[name] presents for routine follow up of chronic med issues including diabetes and hypertension. Patient denies acute complaints.  ? ?Past Medical History:  ?Diagnosis Date  ? A-fib (Tonya Evans)   ? CHF (congestive heart failure) (Tonya Evans)   ? Constipation   ? History of blood clots 04/20/2005  ? Hyperlipemia   ? Hypertension   ? Obesity   ? Type II or unspecified type diabetes mellitus without mention of complication, not stated as uncontrolled   ? ? ?Past Surgical History:  ?Procedure Laterality Date  ? APPENDECTOMY  1979  ? CARDIAC DEFIBRILLATOR PLACEMENT  2008  ? CESAREAN SECTION    ? x 3   ? HERNIA REPAIR  2005  ? TONSILLECTOMY    ? ? ?Family History  ?Problem Relation Age of Onset  ? Diabetes Father   ? Hypertension Mother   ? Diabetes Brother   ? Hypertension Sister   ? Breast cancer Mother   ? Colon cancer Neg Hx   ? ? ?Social History  ? ?Socioeconomic History  ? Marital status: Legally Separated  ?  Spouse name: Not on file  ? Number of children: Not on file  ? Years of education: Not on file  ? Highest education level: Not on file  ?Occupational History  ? Occupation: pastor  ?  Employer: [employer]  ?Tobacco Use  ? Smoking status: Former  ? Smokeless tobacco: Never  ?Substance and Sexual Activity  ? Alcohol use: No  ? Drug use: No  ? Sexual activity: Not on file  ?Other Topics Concern  ? Not on file  ?Social History Narrative  ? No caffeine drinks   ? ?Social Determinants of Health  ? ?Financial Resource Strain: Not on file  ?Food Insecurity: Not on file  ?Transportation Needs: Not on file  ?Physical Activity: Not on file  ?Stress: Not on file  ?Social Connections: Not on file  ?Intimate Partner Violence: Not on file  ? ? ?ROS ?Review of Systems  ?All other systems reviewed and are  negative. ? ?Objective:  ? ?Today's Vitals: BP 116/74   Pulse 80   Temp (!) 97.4 ?F (36.3 ?C) (Oral)   Resp 16   Wt 276 lb (125.2 kg)   SpO2 97%   BMI 45.23 kg/m?  ? ?Physical Exam ?Vitals and nursing note reviewed.  ?Constitutional:   ?   General: She is not in acute distress. ?   Appearance: She is obese.  ?Cardiovascular:  ?   Rate and Rhythm: Normal rate and regular rhythm.  ?   Heart sounds: Normal heart sounds.  ?Pulmonary:  ?   Effort: Pulmonary effort is normal.  ?   Breath sounds: Normal breath sounds.  ?Abdominal:  ?   Palpations: Abdomen is soft.  ?   Tenderness: There is no abdominal tenderness.  ?Musculoskeletal:  ?   Right lower leg: No edema.  ?   Left lower leg: No edema.  ?Neurological:  ?   General: No focal deficit present.  ?   Mental Status: She is alert and oriented to person, place, and time.  ?Psychiatric:     ?   Mood and Affect: Mood normal.     ?   Behavior: Behavior normal.  ? ? ?  Assessment & Plan:  ? ?1. Type 2 diabetes mellitus with other specified complication, unspecified whether long term insulin use ([place]) ?A1c is greatly improved and now at goal. Continue and monitor ? ?2. Essential hypertension ?Appears stable with present management. Continue and monitor ? ?3. History of blood clots ?INR is above goal. Patient to hold coumadin for next 2 evenings and take 1 mg of vitamin K. Will recheck in 2 days. ?- INR ? ?4. Class 3 severe obesity due to excess calories with serious comorbidity and body mass index (BMI) of 45.0 to 49.9 in adult [facility]) ?Patient has lost 10 lbs and is working with weight loss management. continue ? ? ? ?Outpatient Encounter Medications as of 07/02/2021  ?Medication Sig  ? albuterol (PROVENTIL) (2.5 MG/3ML) 0.083% nebulizer solution Take 2.5 mg by nebulization every 4 (four) hours as needed.  ? albuterol (VENTOLIN HFA) 108 (90 Base) MCG/ACT inhaler SMARTSIG:2 Puff(s) By Mouth Every 4 Hours PRN  ? atorvastatin (LIPITOR) 20 MG tablet Take 1 tablet (20 mg total)  by mouth daily.  ? atorvastatin (LIPITOR) 40 MG tablet Take 1 tablet by mouth daily.  ? budesonide (PULMICORT) 0.5 MG/2ML nebulizer solution Take by nebulization daily.  ? carvedilol (COREG) 12.5 MG tablet Take by mouth.  ? carvedilol (COREG) 25 MG tablet Take 1 tablet (25 mg total) by mouth 2 (two) times daily with a meal.  ? cetirizine (ZYRTEC) 10 MG tablet Take 1 tablet (10 mg total) by mouth daily.  ? Cholecalciferol 25 MCG (1000 UT) tablet Take by mouth.  ? clonazePAM (KLONOPIN) 0.5 MG tablet Take by mouth as needed.  ? Continuous Blood Gluc Sensor (FREESTYLE LIBRE 2 SENSOR) MISC Place as directed Q14 days to read blood glucose  ? dapagliflozin propanediol (FARXIGA) 10 MG TABS tablet Take 1 tablet (10 mg total) by mouth daily before breakfast.  ? dapagliflozin propanediol (FARXIGA) 10 MG TABS tablet Take 1 tablet by mouth daily before breakfast.  ? digoxin (LANOXIN) 0.125 MG tablet Take by mouth.  ? digoxin (LANOXIN) 0.25 MG tablet Take by mouth daily.    ? digoxin (LANOXIN) 0.25 MG tablet Take by mouth.  ? Docusate Calcium (STOOL SOFTENER PO) Take by mouth. As needed   ? eplerenone (INSPRA) 25 MG tablet TAKE 1 Tablet BY MOUTH ONCE EVERY DAY  ? ergocalciferol (VITAMIN D2) 1.25 MG (50000 UT) capsule Take by mouth.  ? ezetimibe (ZETIA) 10 MG tablet Take 1 tablet (10 mg total) by mouth daily.  ? ezetimibe (ZETIA) 10 MG tablet TAKE 1 Tablet BY MOUTH ONCE EVERY DAY  ? furosemide (LASIX) 40 MG tablet Take 40 mg by mouth daily.    ? furosemide (LASIX) 40 MG tablet Take by mouth.  ? gabapentin (NEURONTIN) 600 MG tablet Take by mouth.  ? gabapentin (NEURONTIN) 600 MG tablet Take by mouth.  ? hydrALAZINE (APRESOLINE) 25 MG tablet Take 1 tablet (25 mg total) by mouth 2 (two) times daily.  ? Insulin Aspart (NOVOLOG [place]) Inject into the skin. 5 units three times daily with meals   ? insulin aspart (NOVOLOG) 100 UNIT/ML FlexPen Inject into the skin.  ? insulin glargine (LANTUS SOLOSTAR) 100 UNIT/ML Solostar Pen Inject into  the skin.  ? insulin glargine (LANTUS) 100 UNIT/ML injection Inject 0.5 mLs (50 Units total) into the skin 2 (two) times daily.  ? isosorbide mononitrate (IMDUR) 30 MG 24 hr tablet Take 1 tablet by mouth once daily  ? LANTUS SOLOSTAR 100 UNIT/ML Solostar Pen SMARTSIG:50 Unit(s) SUB-Q Twice Daily  ?  liraglutide (VICTOZA) 18 MG/3ML SOPN Inject 1.8 mg into the skin daily.  ? losartan (COZAAR) 100 MG tablet Take 1 tablet (100 mg total) by mouth daily.  ? mometasone-formoterol (DULERA) 200-5 MCG/ACT AERO INHALE 2 PUFFS BY MOUTH TWICE DAILY. RINSE MOUTH AFTER EACH USE  ? omeprazole (PRILOSEC) 40 MG capsule Take 1 capsule by mouth daily.  ? phytonadione (VITAMIN K) 5 MG tablet Take 0.5 tablets (2.5 mg total) by mouth once for 1 dose.  ? prasugrel (EFFIENT) 10 MG TABS tablet Take 1 tablet by mouth daily.  ? prednisoLONE acetate (PRED FORTE) 1 % ophthalmic suspension Place into the right eye.  ? spironolactone (ALDACTONE) 50 MG tablet Take 1 tablet by mouth daily.  ? SPIRONOLACTONE PO Take 25 mg by mouth.   ? sucralfate (CARAFATE) 1 g tablet Take 1 tablet (1 g total) by mouth 4 (four) times daily -  with meals and at bedtime.  ? warfarin (COUMADIN) 2 MG tablet Take 1 tablet (2 mg total) by mouth daily.  ? warfarin (COUMADIN) 5 MG tablet Take 1 tablet (5 mg total) by mouth daily.  ? spironolactone (ALDACTONE) 25 MG tablet Take 1 tablet by mouth daily.  ? ?Facility-Administered Encounter Medications as of 07/02/2021  ?Medication  ? 0.9 %  sodium chloride infusion  ? ? ?Follow-up: No follow-ups on file.  ? ?Becky Sax, MD ? ?

## 2021-07-02 NOTE — Progress Notes (Signed)
Patient is here for 6 month follow-up. Patient has no other concerns. ? ?PTINR. 7.0 ?

## 2021-07-04 ENCOUNTER — Ambulatory Visit (INDEPENDENT_AMBULATORY_CARE_PROVIDER_SITE_OTHER): Payer: HMO

## 2021-07-04 ENCOUNTER — Ambulatory Visit: Payer: HMO

## 2021-07-04 ENCOUNTER — Other Ambulatory Visit: Payer: Self-pay

## 2021-07-04 DIAGNOSIS — Z86718 Personal history of other venous thrombosis and embolism: Secondary | ICD-10-CM | POA: Diagnosis not present

## 2021-07-04 LAB — POCT INR: INR: 2.1 (ref 2.0–3.0)

## 2021-07-04 NOTE — Progress Notes (Signed)
Patient came in to get her INR rechecked ? ?

## 2021-07-09 ENCOUNTER — Other Ambulatory Visit: Payer: Self-pay | Admitting: Family Medicine

## 2021-07-09 MED ORDER — CLONAZEPAM 0.5 MG PO TABS: 0.5000 mg | ORAL_TABLET | ORAL | 0 refills | Status: DC | PRN

## 2021-07-23 ENCOUNTER — Other Ambulatory Visit: Payer: Self-pay | Admitting: Family Medicine

## 2021-07-23 DIAGNOSIS — E1169 Type 2 diabetes mellitus with other specified complication: Secondary | ICD-10-CM

## 2021-07-23 MED ORDER — FREESTYLE LIBRE 2 SENSOR MISC: 5 refills | Status: DC

## 2021-07-31 ENCOUNTER — Other Ambulatory Visit: Payer: Self-pay | Admitting: Family Medicine

## 2021-07-31 MED ORDER — INSULIN ASPART 100 UNIT/ML FLEXPEN: 10.0000 [IU] | PEN_INJECTOR | Freq: Three times a day (TID) | SUBCUTANEOUS | 5 refills | Status: DC

## 2021-08-06 ENCOUNTER — Other Ambulatory Visit: Payer: Self-pay | Admitting: Family Medicine

## 2021-08-06 NOTE — Telephone Encounter (Signed)
Requested Prescriptions  ?Pending Prescriptions Disp Refills  ?? carvedilol (COREG) 25 MG tablet [Pharmacy Med Name: Carvedilol 25 MG Oral Tablet] 180 tablet 0  ?  Sig: TAKE 1 TABLET BY MOUTH TWICE DAILY WITH A MEAL  ?  ? Cardiovascular: Beta Blockers 3 Failed - 08/06/2021  5:31 AM  ?  ?  Failed - Cr in normal range and within 360 days  ?  No results found for: CREATININE, LABCREAU, LABCREA, POCCRE   ?  ?  Failed - AST in normal range and within 360 days  ?  No results found for: POCAST, AST   ?  ?  Failed - ALT in normal range and within 360 days  ?  No results found for: ALT, LABALT, POCALT   ?  ?  Passed - Last BP in normal range  ?  BP Readings from Last 1 Encounters:  ?07/02/21 116/74  ?   ?  ?  Passed - Last Heart Rate in normal range  ?  Pulse Readings from Last 1 Encounters:  ?07/02/21 80  ?   ?  ?  Passed - Valid encounter within last 6 months  ?  Recent Outpatient Visits   ?      ? 1 month ago Type 2 diabetes mellitus with other specified complication, unspecified whether long term insulin use ([place])  ? Primary Care at [facility], MD  ? 7 months ago Type 2 diabetes mellitus with other specified complication, unspecified whether long term insulin use ([place])  ? Primary Care at [place], [place]  ?  ?  ? ?  ?  ?  ?? cetirizine (ZYRTEC) 10 MG tablet [Pharmacy Med Name: Cetirizine HCl 10 MG Oral Tablet] 90 tablet 0  ?  Sig: Take 1 tablet by mouth once daily  ?  ? Ear, Nose, and Throat:  Antihistamines 2 Failed - 08/06/2021  5:31 AM  ?  ?  Failed - Cr in normal range and within 360 days  ?  No results found for: CREATININE, LABCREAU, LABCREA, POCCRE   ?  ?  Passed - Valid encounter within last 12 months  ?  Recent Outpatient Visits   ?      ? 1 month ago Type 2 diabetes mellitus with other specified complication, unspecified whether long term insulin use ([place])  ? Primary Care at [facility], MD  ? 7 months ago Type 2 diabetes mellitus with other specified  complication, unspecified whether long term insulin use ([place])  ? Primary Care at [facility], MD  ?  ?  ? ?  ?  ?  ? ? ?

## 2021-08-18 ENCOUNTER — Other Ambulatory Visit: Payer: Self-pay | Admitting: Family Medicine

## 2021-08-18 MED ORDER — OMEPRAZOLE 40 MG PO CPDR: 40.0000 mg | DELAYED_RELEASE_CAPSULE | Freq: Every day | ORAL | 1 refills | Status: DC

## 2021-08-18 MED ORDER — FLUCONAZOLE 150 MG PO TABS: 150.0000 mg | ORAL_TABLET | Freq: Once | ORAL | 5 refills | Status: AC

## 2021-08-18 MED ORDER — ISOSORBIDE MONONITRATE ER 30 MG PO TB24: 30.0000 mg | ORAL_TABLET | Freq: Every day | ORAL | 1 refills | Status: DC

## 2021-08-20 ENCOUNTER — Other Ambulatory Visit: Payer: Self-pay | Admitting: Family Medicine

## 2021-08-20 MED ORDER — OMEPRAZOLE 40 MG PO CPDR: 40.0000 mg | DELAYED_RELEASE_CAPSULE | Freq: Every day | ORAL | 1 refills | Status: DC

## 2021-08-20 MED ORDER — FLUCONAZOLE 150 MG PO TABS: 150.0000 mg | ORAL_TABLET | Freq: Once | ORAL | 5 refills | Status: AC

## 2021-08-28 ENCOUNTER — Other Ambulatory Visit: Payer: Self-pay | Admitting: Family Medicine

## 2021-08-28 MED ORDER — EZETIMIBE 10 MG PO TABS: ORAL_TABLET | ORAL | 3 refills | Status: DC

## 2021-09-02 ENCOUNTER — Other Ambulatory Visit: Payer: Self-pay | Admitting: Family Medicine

## 2021-09-02 DIAGNOSIS — E1169 Type 2 diabetes mellitus with other specified complication: Secondary | ICD-10-CM

## 2021-09-02 MED ORDER — FREESTYLE LIBRE 2 SENSOR MISC: 1 refills | Status: DC

## 2021-10-06 ENCOUNTER — Other Ambulatory Visit: Payer: Self-pay | Admitting: Family Medicine

## 2021-10-27 ENCOUNTER — Ambulatory Visit (INDEPENDENT_AMBULATORY_CARE_PROVIDER_SITE_OTHER): Payer: HMO | Admitting: Family Medicine

## 2021-10-27 ENCOUNTER — Encounter: Payer: Self-pay | Admitting: Family Medicine

## 2021-10-27 VITALS — BP 131/74 | HR 83 | Temp 98.1°F | Resp 16

## 2021-10-27 DIAGNOSIS — M79672 Pain in left foot: Secondary | ICD-10-CM | POA: Diagnosis not present

## 2021-10-27 DIAGNOSIS — E1169 Type 2 diabetes mellitus with other specified complication: Secondary | ICD-10-CM | POA: Diagnosis not present

## 2021-10-27 DIAGNOSIS — M79671 Pain in right foot: Secondary | ICD-10-CM

## 2021-10-27 DIAGNOSIS — Z6841 Body Mass Index (BMI) 40.0 and over, adult: Secondary | ICD-10-CM

## 2021-10-27 DIAGNOSIS — E66813 Obesity, class 3: Secondary | ICD-10-CM

## 2021-10-27 DIAGNOSIS — Z86718 Personal history of other venous thrombosis and embolism: Secondary | ICD-10-CM

## 2021-10-27 DIAGNOSIS — I10 Essential (primary) hypertension: Secondary | ICD-10-CM

## 2021-10-27 LAB — POCT GLYCOSYLATED HEMOGLOBIN (HGB A1C): Hemoglobin A1C: 7.6 % — AB (ref 4.0–5.6)

## 2021-10-27 NOTE — Progress Notes (Signed)
Patient came in for a f/u visit DM,PTINR  Patient said that she had a lot of fluid on her today.

## 2021-10-29 NOTE — Progress Notes (Signed)
Established Patient Office Visit  Subjective    Patient ID: Tonya Evans, female    DOB: [birth year]  Age: 66 y.o. MRN: [id number]  CC: No chief complaint on file.   HPI Tonya Evans presents for follow up of diabetes and hypertension. She also reports that she has been having increasing foot pain. Patient denies known trauma or injury.    Outpatient Encounter Medications as of 10/27/2021  Medication Sig   albuterol (PROVENTIL) (2.5 MG/3ML) 0.083% nebulizer solution Take 2.5 mg by nebulization every 4 (four) hours as needed.   albuterol (VENTOLIN HFA) 108 (90 Base) MCG/ACT inhaler SMARTSIG:2 Puff(s) By Mouth Every 4 Hours PRN   atorvastatin (LIPITOR) 20 MG tablet Take 1 tablet (20 mg total) by mouth daily.   atorvastatin (LIPITOR) 40 MG tablet Take 1 tablet by mouth daily.   budesonide (PULMICORT) 0.5 MG/2ML nebulizer solution Take by nebulization daily.   carvedilol (COREG) 25 MG tablet TAKE 1 TABLET BY MOUTH TWICE DAILY WITH A MEAL   Cholecalciferol 25 MCG (1000 UT) tablet Take by mouth.   clonazePAM (KLONOPIN) 0.5 MG tablet Take 1 tablet (0.5 mg total) by mouth as needed.   Continuous Blood Gluc Sensor (FREESTYLE LIBRE 2 SENSOR) MISC Place as directed Q14 days to read blood glucose   dapagliflozin propanediol (FARXIGA) 10 MG TABS tablet Take 1 tablet (10 mg total) by mouth daily before breakfast.   dapagliflozin propanediol (FARXIGA) 10 MG TABS tablet Take 1 tablet by mouth daily before breakfast.   digoxin (LANOXIN) 0.125 MG tablet Take by mouth.   Docusate Calcium (STOOL SOFTENER PO) Take by mouth. As needed    EQ ALLERGY RELIEF, CETIRIZINE, 10 MG tablet Take 1 tablet by mouth once daily   ezetimibe (ZETIA) 10 MG tablet Take 1 tablet (10 mg total) by mouth daily.   ezetimibe (ZETIA) 10 MG tablet TAKE 1 Tablet BY MOUTH ONCE EVERY DAY   furosemide (LASIX) 40 MG tablet Take 40 mg by mouth daily.     furosemide (LASIX) 40 MG tablet Take by mouth.   gabapentin (NEURONTIN) 600 MG  tablet Take by mouth.   gabapentin (NEURONTIN) 600 MG tablet Take by mouth.   hydrALAZINE (APRESOLINE) 25 MG tablet Take 1 tablet (25 mg total) by mouth 2 (two) times daily.   Insulin Aspart (NOVOLOG [facility]) Inject into the skin. 5 units three times daily with meals    insulin aspart (NOVOLOG) 100 UNIT/ML FlexPen Inject 10 Units into the skin 3 (three) times daily with meals.   insulin glargine (LANTUS SOLOSTAR) 100 UNIT/ML Solostar Pen Inject into the skin.   insulin glargine (LANTUS) 100 UNIT/ML injection Inject 0.5 mLs (50 Units total) into the skin 2 (two) times daily.   isosorbide mononitrate (IMDUR) 30 MG 24 hr tablet Take 1 tablet (30 mg total) by mouth daily.   LANTUS SOLOSTAR 100 UNIT/ML Solostar Pen SMARTSIG:50 Unit(s) SUB-Q Twice Daily   liraglutide (VICTOZA) 18 MG/3ML SOPN Inject 1.8 mg into the skin daily.   losartan (COZAAR) 100 MG tablet Take 1 tablet by mouth once daily   mometasone-formoterol (DULERA) 200-5 MCG/ACT AERO INHALE 2 PUFFS BY MOUTH TWICE DAILY. RINSE MOUTH AFTER EACH USE   omeprazole (PRILOSEC) 40 MG capsule Take 1 capsule (40 mg total) by mouth daily.   prednisoLONE acetate (PRED FORTE) 1 % ophthalmic suspension Place into the right eye.   spironolactone (ALDACTONE) 25 MG tablet Take 1 tablet by mouth daily.   spironolactone (ALDACTONE) 50 MG tablet Take 1 tablet by  mouth daily.   SPIRONOLACTONE PO Take 25 mg by mouth.    sucralfate (CARAFATE) 1 g tablet Take 1 tablet (1 g total) by mouth 4 (four) times daily -  with meals and at bedtime.   warfarin (COUMADIN) 2 MG tablet Take 1 tablet (2 mg total) by mouth daily.   warfarin (COUMADIN) 5 MG tablet Take 1 tablet by mouth once daily   Facility-Administered Encounter Medications as of 10/27/2021  Medication   0.9 %  sodium chloride infusion    Past Medical History:  Diagnosis Date   A-fib (HCC)    CHF (congestive heart failure) (HCC)    Constipation    History of blood clots 04/20/2005   Hyperlipemia     Hypertension    Obesity    Type II or unspecified type diabetes mellitus without mention of complication, not stated as uncontrolled     Past Surgical History:  Procedure Laterality Date   APPENDECTOMY  1979   CARDIAC DEFIBRILLATOR PLACEMENT  2008   CESAREAN SECTION     x 3    HERNIA REPAIR  2005   TONSILLECTOMY      Family History  Problem Relation Age of Onset   Diabetes Father    Hypertension Mother    Diabetes Brother    Hypertension Sister    Breast cancer Mother    Colon cancer Neg Hx     Social History   Socioeconomic History   Marital status: Legally Separated    Spouse name: Not on file   Number of children: Not on file   Years of education: Not on file   Highest education level: Not on file  Occupational History   Occupation: pastor    Employer: [employer]  Tobacco Use   Smoking status: Former   Smokeless tobacco: Never  Substance and Sexual Activity   Alcohol use: No   Drug use: No   Sexual activity: Not on file  Other Topics Concern   Not on file  Social History Narrative   No caffeine drinks    Social Determinants of Radio broadcast assistant Strain: Not on file  Food Insecurity: Not on file  Transportation Needs: Not on file  Physical Activity: Not on file  Stress: Not on file  Social Connections: Not on file  Intimate Partner Violence: Not on file    Review of Systems  All other systems reviewed and are negative.       Objective    BP 131/74   Pulse 83   Temp 98.1 F (36.7 C) (Oral)   Resp 16   SpO2 95%   Physical Exam Vitals and nursing note reviewed.  Constitutional:      General: She is not in acute distress.    Appearance: She is obese.  Cardiovascular:     Rate and Rhythm: Normal rate and regular rhythm.  Pulmonary:     Effort: Pulmonary effort is normal.     Breath sounds: Normal breath sounds.  Abdominal:     Palpations: Abdomen is soft.     Tenderness: There is no abdominal tenderness.   Musculoskeletal:     Right foot: Tenderness present.     Left foot: Tenderness present.  Skin:    General: Skin is warm and dry.  Neurological:     General: No focal deficit present.     Mental Status: She is alert and oriented to person, place, and time.         Assessment &  Plan:   1. Type 2 diabetes mellitus with other specified complication, unspecified whether long term insulin use (HCC) Increasing A1c and now above goal. Continue present managemnet and monitor - POCT glycosylated hemoglobin (Hb A1C)  2. History of blood clots Awaiting INR results  3. Essential hypertension Appears stable with present management. continue  4. Pain in both feet Referral to podiatry for further eval/mgt - Ambulatory referral to Podiatry  5. Class 3 severe obesity due to excess calories with serious comorbidity and body mass index (BMI) of 45.0 to 49.9 in adult [facility]) Patient to continue with bariatrics management   Return in about 3 months (around 01/27/2022) for follow up.   Becky Sax, MD

## 2021-10-31 ENCOUNTER — Other Ambulatory Visit: Payer: Self-pay | Admitting: Family Medicine

## 2021-10-31 ENCOUNTER — Ambulatory Visit (INDEPENDENT_AMBULATORY_CARE_PROVIDER_SITE_OTHER): Payer: HMO

## 2021-10-31 DIAGNOSIS — Z86718 Personal history of other venous thrombosis and embolism: Secondary | ICD-10-CM | POA: Diagnosis not present

## 2021-10-31 LAB — POCT INR: INR: 1.4 — AB (ref 2.0–3.0)

## 2021-10-31 MED ORDER — WARFARIN SODIUM 5 MG PO TABS: 5.0000 mg | ORAL_TABLET | Freq: Every day | ORAL | 1 refills | Status: DC

## 2021-10-31 MED ORDER — WARFARIN SODIUM 3 MG PO TABS: 3.0000 mg | ORAL_TABLET | Freq: Every day | ORAL | 1 refills | Status: DC

## 2021-10-31 MED ORDER — WARFARIN SODIUM 1 MG PO TABS: 1.0000 mg | ORAL_TABLET | Freq: Every day | ORAL | 1 refills | Status: DC

## 2021-10-31 NOTE — Progress Notes (Signed)
Patient came in for POCT INR Patient had no other concerns today

## 2021-11-13 ENCOUNTER — Other Ambulatory Visit: Payer: Self-pay | Admitting: Family Medicine

## 2021-11-13 DIAGNOSIS — M79671 Pain in right foot: Secondary | ICD-10-CM

## 2021-11-19 ENCOUNTER — Ambulatory Visit: Payer: Self-pay

## 2021-11-24 ENCOUNTER — Telehealth: Payer: Self-pay | Admitting: Family Medicine

## 2021-11-24 NOTE — Telephone Encounter (Signed)
Copied from [place] [id number]. Topic: Referral - Question >> Nov 24, 2021 12:46 PM Everette C wrote: Reason for CRM: Lattie Haw with [facility] has called requesting chart notes indicating that the patient has taken warfarin (COUMADIN) 1 MG tablet [[id number]] for more than 90 days   Lattie Haw would like the chart note faxed to her at [phone] Attention Lattie Haw   Please contact further if needed

## 2021-11-25 NOTE — Telephone Encounter (Signed)
Office notes has been faxed. 

## 2021-11-26 NOTE — Telephone Encounter (Signed)
Tonya Evans with [facility] called back stating she received the chart notes from 10/27/21 and that the 90 days will be 01/25/22. They will then get a meter out to the patient. She just wanted the provider to know. Please assist further if needed.

## 2021-11-26 NOTE — Telephone Encounter (Signed)
FYI

## 2021-12-09 ENCOUNTER — Other Ambulatory Visit: Payer: Self-pay | Admitting: Family Medicine

## 2021-12-09 DIAGNOSIS — E1169 Type 2 diabetes mellitus with other specified complication: Secondary | ICD-10-CM

## 2021-12-09 MED ORDER — TRIAMCINOLONE ACETONIDE 0.1 % EX OINT: TOPICAL_OINTMENT | Freq: Two times a day (BID) | CUTANEOUS | 2 refills | Status: DC | PRN

## 2021-12-09 MED ORDER — LANTUS SOLOSTAR 100 UNIT/ML ~~LOC~~ SOPN
50.0000 [IU] | PEN_INJECTOR | Freq: Two times a day (BID) | SUBCUTANEOUS | 5 refills | Status: DC
Start: 2021-12-09 — End: 2022-06-02

## 2021-12-18 ENCOUNTER — Other Ambulatory Visit: Payer: Self-pay | Admitting: Family Medicine

## 2021-12-18 MED ORDER — AZITHROMYCIN 250 MG PO TABS: ORAL_TABLET | ORAL | 0 refills | Status: DC

## 2021-12-29 ENCOUNTER — Other Ambulatory Visit: Payer: Self-pay | Admitting: Family Medicine

## 2021-12-29 MED ORDER — INSULIN ASPART 100 UNIT/ML FLEXPEN
PEN_INJECTOR | SUBCUTANEOUS | 5 refills | Status: DC
Start: 2021-12-29 — End: 2022-06-02

## 2022-01-01 ENCOUNTER — Other Ambulatory Visit: Payer: Self-pay | Admitting: Family Medicine

## 2022-01-05 ENCOUNTER — Other Ambulatory Visit: Payer: Self-pay | Admitting: Family Medicine

## 2022-01-05 DIAGNOSIS — M722 Plantar fascial fibromatosis: Secondary | ICD-10-CM | POA: Insufficient documentation

## 2022-01-05 DIAGNOSIS — M6701 Short Achilles tendon (acquired), right ankle: Secondary | ICD-10-CM | POA: Insufficient documentation

## 2022-01-05 MED ORDER — DOXYCYCLINE HYCLATE 100 MG PO TABS: 100.0000 mg | ORAL_TABLET | Freq: Two times a day (BID) | ORAL | 0 refills | Status: DC

## 2022-01-08 ENCOUNTER — Telehealth: Payer: Self-pay | Admitting: Family Medicine

## 2022-01-08 NOTE — Telephone Encounter (Signed)
Tonya Evans with [facility] is calling in to make provider aware that pt has received her INR meter. Pt tested on 9/19 and value was 3.0. Results will be faxed to provider as well.    Phone: [phone] ext [phone]

## 2022-01-08 NOTE — Telephone Encounter (Signed)
FYI

## 2022-01-19 ENCOUNTER — Other Ambulatory Visit: Payer: Self-pay | Admitting: Family Medicine

## 2022-01-19 DIAGNOSIS — M79602 Pain in left arm: Secondary | ICD-10-CM

## 2022-01-22 ENCOUNTER — Encounter: Payer: Self-pay | Admitting: Family Medicine

## 2022-01-22 ENCOUNTER — Ambulatory Visit (INDEPENDENT_AMBULATORY_CARE_PROVIDER_SITE_OTHER): Payer: HMO | Admitting: Family Medicine

## 2022-01-22 DIAGNOSIS — M79622 Pain in left upper arm: Secondary | ICD-10-CM

## 2022-01-22 NOTE — Progress Notes (Signed)
Virtual Visit via Telephone Note  I connected with Tonya Evans on 01/22/22 at  1:00 PM EDT by telephone and verified that I am speaking with the correct person using two identifiers.  Location: Patient: [name] Provider: [place]   I discussed the limitations, risks, security and privacy concerns of performing an evaluation and management service by telephone and the availability of in person appointments. I also discussed with the patient that there may be a patient responsible charge related to this service. The patient expressed understanding and agreed to proceed.   History of Present Illness: Patient reports that she felt that she may have bitten been by an insect the first week of August in left upper arm. Initially with some redness and intermittent tenderness. She has undergone 2 courses of antibiotics, (zithromax and doxycycline). Area has become now more tender with hyperpigmentation and sx ar more persistent. Patient denies fever/chills. Denies any drainage.    Observations/Objective:   Assessment and Plan: 1. Pain of left upper arm ?  Deep Abscess that is unable to drain. Will obtain CT of upper arm and referral to gen surg for further eval/mgt.   Follow Up Instructions:    I discussed the assessment and treatment plan with the patient. The patient was provided an opportunity to ask questions and all were answered. The patient agreed with the plan and demonstrated an understanding of the instructions.   The patient was advised to call back or seek an in-person evaluation if the symptoms worsen or if the condition fails to improve as anticipated.  I provided 12 minutes of non-face-to-face time during this encounter.   Becky Sax, MD

## 2022-01-27 LAB — PROTIME-INR: Protime: 2 — AB (ref 10.0–13.8)

## 2022-01-28 DIAGNOSIS — L03114 Cellulitis of left upper limb: Secondary | ICD-10-CM | POA: Insufficient documentation

## 2022-01-29 ENCOUNTER — Ambulatory Visit (INDEPENDENT_AMBULATORY_CARE_PROVIDER_SITE_OTHER): Payer: HMO | Admitting: Family Medicine

## 2022-01-29 ENCOUNTER — Encounter: Payer: Self-pay | Admitting: Family Medicine

## 2022-01-29 VITALS — BP 128/73 | HR 82 | Temp 98.1°F | Resp 16 | Wt 290.0 lb

## 2022-01-29 DIAGNOSIS — E1169 Type 2 diabetes mellitus with other specified complication: Secondary | ICD-10-CM | POA: Diagnosis not present

## 2022-01-29 DIAGNOSIS — E785 Hyperlipidemia, unspecified: Secondary | ICD-10-CM

## 2022-01-29 DIAGNOSIS — M79602 Pain in left arm: Secondary | ICD-10-CM

## 2022-01-29 DIAGNOSIS — Z6841 Body Mass Index (BMI) 40.0 and over, adult: Secondary | ICD-10-CM

## 2022-01-29 DIAGNOSIS — I10 Essential (primary) hypertension: Secondary | ICD-10-CM | POA: Diagnosis not present

## 2022-01-29 LAB — POCT GLYCOSYLATED HEMOGLOBIN (HGB A1C): Hemoglobin A1C: 9.2 % — AB (ref 4.0–5.6)

## 2022-02-02 ENCOUNTER — Other Ambulatory Visit: Payer: Self-pay | Admitting: Family Medicine

## 2022-02-02 NOTE — Progress Notes (Signed)
Established Patient Office Visit  Subjective    Patient ID: Tonya Evans, female    DOB: 1955-12-03  Age: 66 y.o. MRN: [id number]  CC:  Chief Complaint  Patient presents with   Follow-up    3 f/u    HPI Tonya Evans presents for routine follow up of chronic med issues. Patient also was seen at Gen Surg for pain in her left arm.    Outpatient Encounter Medications as of 01/29/2022  Medication Sig   albuterol (PROVENTIL) (2.5 MG/3ML) 0.083% nebulizer solution Take 2.5 mg by nebulization every 4 (four) hours as needed.   atorvastatin (LIPITOR) 20 MG tablet Take 1 tablet (20 mg total) by mouth daily.   atorvastatin (LIPITOR) 40 MG tablet Take 1 tablet by mouth daily.   azithromycin (ZITHROMAX) 250 MG tablet TAKE 2 TABLETS BY MOUTH ON DAY 1, AND THEN TAKE 1 TABLET BY MOUTH ONCE A DAY ON DAY 2 THROUGH DAY 5   budesonide (PULMICORT) 0.5 MG/2ML nebulizer solution Take by nebulization daily.   carvedilol (COREG) 25 MG tablet TAKE 1 TABLET BY MOUTH TWICE DAILY WITH A MEAL   Cholecalciferol 25 MCG (1000 UT) tablet Take by mouth.   clonazePAM (KLONOPIN) 0.5 MG tablet Take 1 tablet (0.5 mg total) by mouth as needed.   Continuous Blood Gluc Sensor (FREESTYLE LIBRE 2 SENSOR) MISC Place as directed Q14 days to read blood glucose   digoxin (LANOXIN) 0.125 MG tablet Take by mouth.   Docusate Calcium (STOOL SOFTENER PO) Take by mouth. As needed    doxycycline (VIBRA-TABS) 100 MG tablet Take 1 tablet (100 mg total) by mouth 2 (two) times daily.   EQ ALLERGY RELIEF, CETIRIZINE, 10 MG tablet Take 1 tablet by mouth once daily   ezetimibe (ZETIA) 10 MG tablet Take 1 tablet (10 mg total) by mouth daily.   ezetimibe (ZETIA) 10 MG tablet TAKE 1 Tablet BY MOUTH ONCE EVERY DAY   furosemide (LASIX) 40 MG tablet Take 40 mg by mouth daily.     furosemide (LASIX) 40 MG tablet Take by mouth.   gabapentin (NEURONTIN) 600 MG tablet Take by mouth.   gabapentin (NEURONTIN) 600 MG tablet Take by mouth.   hydrALAZINE  (APRESOLINE) 25 MG tablet Take 1 tablet (25 mg total) by mouth 2 (two) times daily.   Insulin Aspart (NOVOLOG [place]) Inject into the skin. 5 units three times daily with meals    insulin aspart (NOVOLOG) 100 UNIT/ML FlexPen Inject 15 units SQ 3 times daily before meals.   insulin glargine (LANTUS SOLOSTAR) 100 UNIT/ML Solostar Pen Inject into the skin.   insulin glargine (LANTUS SOLOSTAR) 100 UNIT/ML Solostar Pen Inject 50 Units into the skin 2 (two) times daily.   isosorbide mononitrate (IMDUR) 30 MG 24 hr tablet Take 1 tablet (30 mg total) by mouth daily.   LANTUS SOLOSTAR 100 UNIT/ML Solostar Pen SMARTSIG:50 Unit(s) SUB-Q Twice Daily   liraglutide (VICTOZA) 18 MG/3ML SOPN Inject 1.8 mg into the skin daily.   losartan (COZAAR) 100 MG tablet Take 1 tablet by mouth once daily   mometasone-formoterol (DULERA) 200-5 MCG/ACT AERO INHALE 2 PUFFS BY MOUTH TWICE DAILY. RINSE MOUTH AFTER EACH USE   omeprazole (PRILOSEC) 40 MG capsule Take 1 capsule (40 mg total) by mouth daily.   prednisoLONE acetate (PRED FORTE) 1 % ophthalmic suspension Place into the right eye.   spironolactone (ALDACTONE) 25 MG tablet Take 1 tablet by mouth daily.   spironolactone (ALDACTONE) 50 MG tablet Take 1 tablet by mouth  daily.   SPIRONOLACTONE PO Take 25 mg by mouth.    sucralfate (CARAFATE) 1 g tablet Take 1 tablet (1 g total) by mouth 4 (four) times daily -  with meals and at bedtime.   triamcinolone 0.1% oint-Eucerin equivalent cream 1:1 mixture Apply topically 2 (two) times daily as needed.   warfarin (COUMADIN) 1 MG tablet Take 1 tablet (1 mg total) by mouth daily.   warfarin (COUMADIN) 2 MG tablet Take 1 tablet (2 mg total) by mouth daily.   warfarin (COUMADIN) 3 MG tablet Take 1 tablet (3 mg total) by mouth daily.   warfarin (COUMADIN) 5 MG tablet Take 1 tablet (5 mg total) by mouth daily.   [DISCONTINUED] albuterol (VENTOLIN HFA) 108 (90 Base) MCG/ACT inhaler SMARTSIG:2 Puff(s) By Mouth Every 4 Hours PRN    [DISCONTINUED] dapagliflozin propanediol (FARXIGA) 10 MG TABS tablet Take 1 tablet (10 mg total) by mouth daily before breakfast.   [DISCONTINUED] dapagliflozin propanediol (FARXIGA) 10 MG TABS tablet Take 1 tablet by mouth daily before breakfast.   Facility-Administered Encounter Medications as of 01/29/2022  Medication   0.9 %  sodium chloride infusion    Past Medical History:  Diagnosis Date   A-fib (HCC)    CHF (congestive heart failure) (HCC)    Constipation    History of blood clots 04/20/2005   Hyperlipemia    Hypertension    Obesity    Type II or unspecified type diabetes mellitus without mention of complication, not stated as uncontrolled     Past Surgical History:  Procedure Laterality Date   APPENDECTOMY  1979   CARDIAC DEFIBRILLATOR PLACEMENT  2008   CESAREAN SECTION     x 3    HERNIA REPAIR  2005   TONSILLECTOMY      Family History  Problem Relation Age of Onset   Diabetes Father    Hypertension Mother    Diabetes Brother    Hypertension Sister    Breast cancer Mother    Colon cancer Neg Hx     Social History   Socioeconomic History   Marital status: Legally Separated    Spouse name: Not on file   Number of children: Not on file   Years of education: Not on file   Highest education level: Not on file  Occupational History   Occupation: pastor    Employer: [employer]  Tobacco Use   Smoking status: Former   Smokeless tobacco: Never  Substance and Sexual Activity   Alcohol use: No   Drug use: No   Sexual activity: Not on file  Other Topics Concern   Not on file  Social History Narrative   No caffeine drinks    Social Determinants of Radio broadcast assistant Strain: Not on file  Food Insecurity: Not on file  Transportation Needs: Not on file  Physical Activity: Not on file  Stress: Not on file  Social Connections: Not on file  Intimate Partner Violence: Not on file    Review of Systems  All other systems reviewed and are  negative.       Objective    BP 128/73   Pulse 82   Temp 98.1 F (36.7 C)   Resp 16   Wt 290 lb (131.5 kg)   SpO2 94%   BMI 47.52 kg/m   Physical Exam Vitals and nursing note reviewed.  Constitutional:      General: She is not in acute distress.    Appearance: She is obese.  Cardiovascular:     Rate and Rhythm: Normal rate and regular rhythm.  Pulmonary:     Effort: Pulmonary effort is normal.     Breath sounds: Normal breath sounds.  Abdominal:     Palpations: Abdomen is soft.     Tenderness: There is no abdominal tenderness.  Skin:    General: Skin is warm and dry.     Comments: Left  upper arm with area of tenderness, no fluctuance, hyperpigmented eschar lesion.   Neurological:     General: No focal deficit present.     Mental Status: She is alert and oriented to person, place, and time.         Assessment & Plan:   1. Type 2 diabetes mellitus with other specified complication, unspecified whether long term insulin use (HCC) Increasing A1c and not at goal. Discussed compliance. Continue present management and monitor - POCT glycosylated hemoglobin (Hb A1C) - Lipid Panel - Basic Metabolic Panel - AST - ALT  2. Essential hypertension Appears stable with present management. Continue  - Lipid Panel - Basic Metabolic Panel - AST - ALT  3. Hyperlipidemia, unspecified hyperlipidemia type Continue  - Lipid Panel - AST - ALT  4. Left arm pain Patient defers further mgt per consultant until after trial of topicals.   5. Class 3 severe obesity due to excess calories with serious comorbidity and body mass index (BMI) of 45.0 to 49.9 in adult [facility]) Continue management per consultant    Return in about 3 months (around 05/01/2022) for follow up.   Becky Sax, MD

## 2022-02-03 LAB — PROTIME-INR: Protime: 1.6 — AB (ref 10.0–13.8)

## 2022-02-05 ENCOUNTER — Other Ambulatory Visit: Payer: Self-pay | Admitting: Family Medicine

## 2022-02-05 LAB — LIPID PANEL
Chol/HDL Ratio: 3.2 ratio (ref 0.0–4.4)
Cholesterol, Total: 111 mg/dL (ref 100–199)
HDL: 35 mg/dL — ABNORMAL LOW (ref >39)
LDL Chol Calc (NIH): 55 mg/dL (ref 0–99)
Triglycerides: 111 mg/dL (ref 0–149)
VLDL Cholesterol Cal: 21 mg/dL (ref 5–40)

## 2022-02-05 LAB — BASIC METABOLIC PANEL
BUN/Creatinine Ratio: 27 (ref 12–28)
BUN: 45 mg/dL — ABNORMAL HIGH (ref 8–27)
CO2: 20 mmol/L (ref 20–29)
Calcium: 9 mg/dL (ref 8.7–10.3)
Chloride: 99 mmol/L (ref 96–106)
Creatinine, Ser: 1.65 mg/dL — ABNORMAL HIGH (ref 0.57–1.00)
Glucose: 259 mg/dL — ABNORMAL HIGH (ref 70–99)
Potassium: 4.1 mmol/L (ref 3.5–5.2)
Sodium: 141 mmol/L (ref 134–144)
eGFR: 34 mL/min/{1.73_m2} — ABNORMAL LOW (ref >59)

## 2022-02-05 LAB — AST: AST: 20 IU/L (ref 0–40)

## 2022-02-05 LAB — ALT: ALT: 16 IU/L (ref 0–32)

## 2022-02-05 LAB — SPECIMEN STATUS REPORT

## 2022-02-05 MED ORDER — MUPIROCIN CALCIUM 2 % EX CREA: 1.0000 | TOPICAL_CREAM | Freq: Two times a day (BID) | CUTANEOUS | 0 refills | Status: DC

## 2022-02-05 NOTE — Progress Notes (Signed)
mucip

## 2022-02-09 ENCOUNTER — Other Ambulatory Visit: Payer: Self-pay | Admitting: Family Medicine

## 2022-02-09 MED ORDER — CARVEDILOL 25 MG PO TABS: 25.0000 mg | ORAL_TABLET | Freq: Two times a day (BID) | ORAL | 1 refills | Status: DC

## 2022-02-09 MED ORDER — CETIRIZINE HCL 10 MG PO TABS: 10.0000 mg | ORAL_TABLET | Freq: Every day | ORAL | 1 refills | Status: DC

## 2022-02-10 LAB — PROTIME-INR: Protime: 2.5 — AB (ref 10.0–13.8)

## 2022-02-13 ENCOUNTER — Other Ambulatory Visit: Payer: Self-pay | Admitting: Family Medicine

## 2022-02-13 MED ORDER — OMEPRAZOLE 40 MG PO CPDR: 40.0000 mg | DELAYED_RELEASE_CAPSULE | Freq: Every day | ORAL | 1 refills | Status: DC

## 2022-02-16 ENCOUNTER — Other Ambulatory Visit: Payer: Self-pay | Admitting: Family Medicine

## 2022-02-16 MED ORDER — FUROSEMIDE 40 MG PO TABS: 80.0000 mg | ORAL_TABLET | Freq: Two times a day (BID) | ORAL | 1 refills | Status: DC

## 2022-02-18 ENCOUNTER — Encounter: Payer: Self-pay | Admitting: Family Medicine

## 2022-02-19 LAB — PROTIME-INR: Protime: 2.7 — AB (ref 10.0–13.8)

## 2022-02-20 ENCOUNTER — Other Ambulatory Visit: Payer: Self-pay | Admitting: Family Medicine

## 2022-02-20 MED ORDER — CYCLOBENZAPRINE HCL 5 MG PO TABS: 5.0000 mg | ORAL_TABLET | Freq: Three times a day (TID) | ORAL | 1 refills | Status: DC | PRN

## 2022-02-21 ENCOUNTER — Other Ambulatory Visit: Payer: Self-pay | Admitting: Family Medicine

## 2022-02-23 NOTE — Telephone Encounter (Signed)
Requested medications are due for refill today.  yes  Requested medications are on the active medications list.  yes  Last refill. 05/21/2021 #180 1 rf  Future visit scheduled.   no  Notes to clinic.  Refill failed protocol d/t missing labs.    Requested Prescriptions  Pending Prescriptions Disp Refills   hydrALAZINE (APRESOLINE) 25 MG tablet [Pharmacy Med Name: hydrALAZINE HCl 25 MG Oral Tablet] 180 tablet 0    Sig: Take 1 tablet by mouth twice daily     Cardiovascular:  Vasodilators Failed - 02/21/2022  9:14 PM      Failed - HCT in normal range and within 360 days    No results found for: "HCT", "HCTKUC", "SRHCT"       Failed - HGB in normal range and within 360 days    No results found for: "HGB", "HGBKUC", "HGBPOCKUC", "HGBOTHER", "TOTHGB", "HGBPLASMA"       Failed - RBC in normal range and within 360 days    No results found for: "RBC", "RBCKUC"       Failed - WBC in normal range and within 360 days    No results found for: "WBC", "[place]"       Failed - PLT in normal range and within 360 days    No results found for: "PLT", "PLTCOUNTKUC", "LABPLAT", "POCPLA"       Failed - ANA Screen, Ifa, Serum in normal range and within 360 days    No results found for: "ANA", "ANATITER", "LABANTI"       Passed - Last BP in normal range    BP Readings from Last 1 Encounters:  01/29/22 128/73         Passed - Valid encounter within last 12 months    Recent Outpatient Visits           3 weeks ago Type 2 diabetes mellitus with other specified complication, unspecified whether long term insulin use ([place])   Primary Care at [facility], Clyde Canterbury, MD   1 month ago Pain of left upper arm   Primary Care at [facility], Clyde Canterbury, MD   3 months ago Type 2 diabetes mellitus with other specified complication, unspecified whether long term insulin use [facility])   Primary Care at [facility], Clyde Canterbury, MD   7 months ago Type 2 diabetes mellitus with other specified  complication, unspecified whether long term insulin use [facility])   Primary Care at [facility], Clyde Canterbury, MD   1 year ago Type 2 diabetes mellitus with other specified complication, unspecified whether long term insulin use [facility])   Primary Care at [facility], MD

## 2022-02-24 LAB — PROTIME-INR: Protime: 2.4 — AB (ref 10.0–13.8)

## 2022-03-04 DIAGNOSIS — Z09 Encounter for follow-up examination after completed treatment for conditions other than malignant neoplasm: Secondary | ICD-10-CM | POA: Insufficient documentation

## 2022-03-05 LAB — PROTIME-INR: Protime: 2.1 — AB (ref 10.0–13.8)

## 2022-03-06 ENCOUNTER — Other Ambulatory Visit: Payer: Self-pay | Admitting: *Deleted

## 2022-03-10 ENCOUNTER — Other Ambulatory Visit: Payer: Self-pay | Admitting: *Deleted

## 2022-03-10 LAB — PROTIME-INR: Protime: 3.2 — AB (ref 10.0–13.8)

## 2022-03-16 ENCOUNTER — Other Ambulatory Visit: Payer: Self-pay | Admitting: Family Medicine

## 2022-03-16 MED ORDER — BENZONATATE 100 MG PO CAPS: 100.0000 mg | ORAL_CAPSULE | Freq: Three times a day (TID) | ORAL | 0 refills | Status: DC | PRN

## 2022-03-16 MED ORDER — DIGOXIN 125 MCG PO TABS: 0.1250 mg | ORAL_TABLET | Freq: Every day | ORAL | 1 refills | Status: DC

## 2022-03-19 LAB — PROTIME-INR: Protime: 3.7 — AB (ref 10.0–13.8)

## 2022-03-20 ENCOUNTER — Other Ambulatory Visit: Payer: Self-pay | Admitting: *Deleted

## 2022-03-24 LAB — PROTIME-INR: Protime: 3.7 — AB (ref 10.0–13.8)

## 2022-03-26 ENCOUNTER — Ambulatory Visit (INDEPENDENT_AMBULATORY_CARE_PROVIDER_SITE_OTHER): Payer: PPO | Admitting: Family Medicine

## 2022-03-26 ENCOUNTER — Encounter: Payer: Self-pay | Admitting: Family Medicine

## 2022-03-26 VITALS — BP 137/76 | HR 84 | Temp 98.1°F

## 2022-03-26 DIAGNOSIS — M542 Cervicalgia: Secondary | ICD-10-CM

## 2022-03-26 MED ORDER — KETOROLAC TROMETHAMINE 60 MG/2ML IM SOLN
60.0000 mg | Freq: Once | INTRAMUSCULAR | Status: AC
  Administered 2022-03-26: 60 mg via INTRAMUSCULAR

## 2022-03-26 MED ORDER — PROMETHAZINE HCL 25 MG/ML IJ SOLN
25.0000 mg | Freq: Four times a day (QID) | INTRAMUSCULAR | Status: AC | PRN
  Administered 2022-03-26: 25 mg via INTRAMUSCULAR

## 2022-03-26 MED ORDER — TRIAMCINOLONE ACETONIDE 40 MG/ML IJ SUSP
40.0000 mg | Freq: Once | INTRAMUSCULAR | Status: AC
  Administered 2022-03-26: 40 mg via INTRAMUSCULAR

## 2022-03-26 NOTE — Progress Notes (Signed)
Neck pain since Monday  No relief with muscle relaxer, Voltaren gel, Biofreeze, Chiropractor, or Tylenol. Pain 9/10 Has radiated to shoulders but mainly in her neck

## 2022-03-27 ENCOUNTER — Encounter: Payer: Self-pay | Admitting: Family Medicine

## 2022-03-27 NOTE — Progress Notes (Signed)
Established Patient Office Visit  Subjective    Patient ID: Tonya Evans, female    DOB: [birth year]  Age: 66 y.o. MRN: [id number]  CC:  Chief Complaint  Patient presents with   Neck Pain    HPI Tonya Evans presents with complaint of neck pain. She denies known trauma or injury. She has been utilizing chiropractor, massage, voltaren gel, biofreeze, etc with no relief.    Outpatient Encounter Medications as of 03/26/2022  Medication Sig   albuterol (PROVENTIL) (2.5 MG/3ML) 0.083% nebulizer solution Take 2.5 mg by nebulization every 4 (four) hours as needed.   atorvastatin (LIPITOR) 40 MG tablet Take 1 tablet by mouth daily.   budesonide (PULMICORT) 0.5 MG/2ML nebulizer solution Take by nebulization daily.   carvedilol (COREG) 25 MG tablet Take 1 tablet (25 mg total) by mouth 2 (two) times daily with a meal.   cetirizine (ZYRTEC) 10 MG tablet Take 1 tablet (10 mg total) by mouth daily.   Cholecalciferol 25 MCG (1000 UT) tablet Take by mouth.   clonazePAM (KLONOPIN) 0.5 MG tablet Take 1 tablet (0.5 mg total) by mouth as needed.   Continuous Blood Gluc Sensor (FREESTYLE LIBRE 2 SENSOR) MISC Place as directed Q14 days to read blood glucose   cyclobenzaprine (FLEXERIL) 5 MG tablet Take 1 tablet (5 mg total) by mouth 3 (three) times daily as needed for muscle spasms.   digoxin (LANOXIN) 0.125 MG tablet Take 1 tablet (0.125 mg total) by mouth daily.   ezetimibe (ZETIA) 10 MG tablet TAKE 1 Tablet BY MOUTH ONCE EVERY DAY   furosemide (LASIX) 40 MG tablet Take 2 tablets (80 mg total) by mouth 2 (two) times daily.   gabapentin (NEURONTIN) 600 MG tablet Take by mouth.   hydrALAZINE (APRESOLINE) 25 MG tablet Take 1 tablet by mouth twice daily   insulin aspart (NOVOLOG) 100 UNIT/ML FlexPen Inject 15 units SQ 3 times daily before meals.   insulin glargine (LANTUS SOLOSTAR) 100 UNIT/ML Solostar Pen Inject 50 Units into the skin 2 (two) times daily.   isosorbide mononitrate (IMDUR) 30 MG 24 hr tablet  Take 1 tablet (30 mg total) by mouth daily.   LANTUS SOLOSTAR 100 UNIT/ML Solostar Pen SMARTSIG:50 Unit(s) SUB-Q Twice Daily   losartan (COZAAR) 100 MG tablet Take 1 tablet by mouth once daily   mometasone-formoterol (DULERA) 200-5 MCG/ACT AERO INHALE 2 PUFFS BY MOUTH TWICE DAILY. RINSE MOUTH AFTER EACH USE   omeprazole (PRILOSEC) 40 MG capsule Take 1 capsule (40 mg total) by mouth daily.   SPIRONOLACTONE PO Take 25 mg by mouth.    warfarin (COUMADIN) 1 MG tablet Take 1 tablet (1 mg total) by mouth daily.   warfarin (COUMADIN) 5 MG tablet Take 1 tablet (5 mg total) by mouth daily.   Docusate Calcium (STOOL SOFTENER PO) Take by mouth. As needed    EQ ALLERGY RELIEF, CETIRIZINE, 10 MG tablet Take 1 tablet by mouth once daily   liraglutide (VICTOZA) 18 MG/3ML SOPN Inject 1.8 mg into the skin daily. (Patient not taking: Reported on 03/26/2022)   mupirocin cream (BACTROBAN) 2 % Apply 1 Application topically 2 (two) times daily. (Patient not taking: Reported on 03/26/2022)   sucralfate (CARAFATE) 1 g tablet Take 1 tablet (1 g total) by mouth 4 (four) times daily -  with meals and at bedtime. (Patient not taking: Reported on 03/26/2022)   triamcinolone 0.1% oint-Eucerin equivalent cream 1:1 mixture Apply topically 2 (two) times daily as needed.   warfarin (COUMADIN) 2 MG tablet  Take 1 tablet (2 mg total) by mouth daily. (Patient not taking: Reported on 03/26/2022)   warfarin (COUMADIN) 3 MG tablet Take 1 tablet (3 mg total) by mouth daily. (Patient not taking: Reported on 03/26/2022)   [DISCONTINUED] atorvastatin (LIPITOR) 20 MG tablet Take 1 tablet (20 mg total) by mouth daily. (Patient not taking: Reported on 03/26/2022)   [DISCONTINUED] azithromycin (ZITHROMAX) 250 MG tablet TAKE 2 TABLETS BY MOUTH ON DAY 1, AND THEN TAKE 1 TABLET BY MOUTH ONCE A DAY ON DAY 2 THROUGH DAY 5 (Patient not taking: Reported on 03/26/2022)   [DISCONTINUED] benzonatate (TESSALON) 100 MG capsule Take 1 capsule (100 mg total) by mouth  3 (three) times daily as needed for cough. (Patient not taking: Reported on 03/26/2022)   [DISCONTINUED] doxycycline (VIBRA-TABS) 100 MG tablet Take 1 tablet (100 mg total) by mouth 2 (two) times daily. (Patient not taking: Reported on 03/26/2022)   [DISCONTINUED] ezetimibe (ZETIA) 10 MG tablet Take 1 tablet (10 mg total) by mouth daily.   [DISCONTINUED] furosemide (LASIX) 40 MG tablet Take by mouth.   [DISCONTINUED] gabapentin (NEURONTIN) 600 MG tablet Take by mouth.   [DISCONTINUED] Insulin Aspart (NOVOLOG [place]) Inject into the skin. 5 units three times daily with meals    [DISCONTINUED] insulin glargine (LANTUS SOLOSTAR) 100 UNIT/ML Solostar Pen Inject into the skin.   [DISCONTINUED] prednisoLONE acetate (PRED FORTE) 1 % ophthalmic suspension Place into the right eye. (Patient not taking: Reported on 03/26/2022)   [DISCONTINUED] spironolactone (ALDACTONE) 25 MG tablet Take 1 tablet by mouth daily.   [DISCONTINUED] spironolactone (ALDACTONE) 50 MG tablet Take 1 tablet by mouth daily. (Patient not taking: Reported on 03/26/2022)   Facility-Administered Encounter Medications as of 03/26/2022  Medication   0.9 %  sodium chloride infusion   [COMPLETED] ketorolac (TORADOL) injection 60 mg   promethazine (PHENERGAN) injection 25 mg   [COMPLETED] triamcinolone acetonide (KENALOG-40) injection 40 mg    Past Medical History:  Diagnosis Date   A-fib (HCC)    CHF (congestive heart failure) (HCC)    Constipation    History of blood clots 04/20/2005   Hyperlipemia    Hypertension    Obesity    Type II or unspecified type diabetes mellitus without mention of complication, not stated as uncontrolled     Past Surgical History:  Procedure Laterality Date   APPENDECTOMY  1979   CARDIAC DEFIBRILLATOR PLACEMENT  2008   CESAREAN SECTION     x 3    HERNIA REPAIR  2005   TONSILLECTOMY      Family History  Problem Relation Age of Onset   Diabetes Father    Hypertension Mother    Diabetes Brother     Hypertension Sister    Breast cancer Mother    Colon cancer Neg Hx     Social History   Socioeconomic History   Marital status: Legally Separated    Spouse name: Not on file   Number of children: Not on file   Years of education: Not on file   Highest education level: Not on file  Occupational History   Occupation: pastor    Employer: [employer]  Tobacco Use   Smoking status: Former   Smokeless tobacco: Never  Substance and Sexual Activity   Alcohol use: No   Drug use: No   Sexual activity: Not on file  Other Topics Concern   Not on file  Social History Narrative   No caffeine drinks    Social Determinants of Health  Financial Resource Strain: Not on file  Food Insecurity: Not on file  Transportation Needs: Not on file  Physical Activity: Not on file  Stress: Not on file  Social Connections: Not on file  Intimate Partner Violence: Not on file    Review of Systems  Musculoskeletal:  Positive for neck pain.  All other systems reviewed and are negative.       Objective    BP 137/76   Pulse 84   Temp 98.1 F (36.7 C) (Oral)   SpO2 93%   Physical Exam Vitals and nursing note reviewed.  Constitutional:      General: She is not in acute distress.    Appearance: She is obese.  Cardiovascular:     Rate and Rhythm: Normal rate and regular rhythm.  Pulmonary:     Effort: Pulmonary effort is normal.     Breath sounds: Normal breath sounds.  Musculoskeletal:     Cervical back: Neck supple. No edema. Pain with movement and muscular tenderness present. Decreased range of motion.  Neurological:     General: No focal deficit present.     Mental Status: She is alert and oriented to person, place, and time.         Assessment & Plan:   1. Neck pain Kenalog, toradol, and phenergan IM injections given. Continue with heat/ice and topical preps prn.  - triamcinolone acetonide (KENALOG-40) injection 40 mg - ketorolac (TORADOL) injection 60 mg -  promethazine (PHENERGAN) injection 25 mg  Return if symptoms worsen or fail to improve.   Becky Sax, MD

## 2022-03-31 DIAGNOSIS — S90211A Contusion of right great toe with damage to nail, initial encounter: Secondary | ICD-10-CM | POA: Insufficient documentation

## 2022-04-02 LAB — PROTIME-INR: Protime: 2.6 — AB (ref 10.0–13.8)

## 2022-04-03 DIAGNOSIS — M25511 Pain in right shoulder: Secondary | ICD-10-CM | POA: Insufficient documentation

## 2022-04-04 ENCOUNTER — Other Ambulatory Visit: Payer: Self-pay | Admitting: Family Medicine

## 2022-04-07 LAB — PROTIME-INR: Protime: 2.8 — AB (ref 10.0–13.8)

## 2022-04-08 ENCOUNTER — Other Ambulatory Visit: Payer: Self-pay | Admitting: Family Medicine

## 2022-04-08 MED ORDER — WARFARIN SODIUM 5 MG PO TABS: 5.0000 mg | ORAL_TABLET | Freq: Every day | ORAL | 1 refills | Status: DC

## 2022-04-10 ENCOUNTER — Encounter: Payer: Self-pay | Admitting: Family Medicine

## 2022-04-14 ENCOUNTER — Encounter: Payer: Self-pay | Admitting: Family Medicine

## 2022-04-14 ENCOUNTER — Other Ambulatory Visit: Payer: Self-pay | Admitting: Family Medicine

## 2022-04-17 ENCOUNTER — Telehealth: Payer: Self-pay | Admitting: Family Medicine

## 2022-04-17 NOTE — Telephone Encounter (Signed)
Left message for patient to call back and schedule Medicare Annual Wellness Visit (AWV) either virtually or phone . Left  my jabber number [phone]   awvi 11/18/21 per [place]  please schedule with Nurse Health Adviser   45 min for awv-i and in office appointments 30 min for awv-s  phone/virtual appointments  

## 2022-04-19 LAB — PROTIME-INR: Protime: 2.8 — AB (ref 10.0–13.8)

## 2022-04-21 ENCOUNTER — Other Ambulatory Visit: Payer: Self-pay | Admitting: Family Medicine

## 2022-04-22 ENCOUNTER — Other Ambulatory Visit: Payer: Self-pay | Admitting: Family Medicine

## 2022-04-22 MED ORDER — MUPIROCIN CALCIUM 2 % EX CREA: 1.0000 | TOPICAL_CREAM | Freq: Two times a day (BID) | CUTANEOUS | 0 refills | Status: DC

## 2022-04-24 ENCOUNTER — Other Ambulatory Visit: Payer: Self-pay | Admitting: Family Medicine

## 2022-04-24 NOTE — Telephone Encounter (Signed)
Requested medication (s) are due for refill today - no  Requested medication (s) are on the active medication list -yes  Future visit scheduled -no  Last refill: 04/22/22  Notes to clinic: Pharmacy request: Alternative requested cash [place] $21.32  Requested Prescriptions  Pending Prescriptions Disp Refills   mupirocin cream (BACTROBAN) 2 % [Pharmacy Med Name: MUPIROCIN 2% CRM] 30 g 0    Sig: APPLY  CREAM TOPICALLY TWICE DAILY     Off-Protocol Failed - 04/24/2022 12:28 PM      Failed - Medication not assigned to a protocol, review manually.      Passed - Valid encounter within last 12 months    Recent Outpatient Visits           4 weeks ago Neck pain   Primary Care at [facility], Clyde Canterbury, MD   2 months ago Type 2 diabetes mellitus with other specified complication, unspecified whether long term insulin use [facility])   Primary Care at [facility], Clyde Canterbury, MD   3 months ago Pain of left upper arm   Primary Care at [facility], Clyde Canterbury, MD   5 months ago Type 2 diabetes mellitus with other specified complication, unspecified whether long term insulin use [facility])   Primary Care at [facility], MD   9 months ago Type 2 diabetes mellitus with other specified complication, unspecified whether long term insulin use [facility])   Primary Care at [facility], MD                 Requested Prescriptions  Pending Prescriptions Disp Refills   mupirocin cream (BACTROBAN) 2 % [Pharmacy Med Name: MUPIROCIN 2% CRM] 30 g 0    Sig: APPLY  CREAM TOPICALLY TWICE DAILY     Off-Protocol Failed - 04/24/2022 12:28 PM      Failed - Medication not assigned to a protocol, review manually.      Passed - Valid encounter within last 12 months    Recent Outpatient Visits           4 weeks ago Neck pain   Primary Care at [facility], Clyde Canterbury, MD   2 months ago Type 2 diabetes mellitus with other specified complication, unspecified whether  long term insulin use [facility])   Primary Care at [facility], Clyde Canterbury, MD   3 months ago Pain of left upper arm   Primary Care at [facility], Clyde Canterbury, MD   5 months ago Type 2 diabetes mellitus with other specified complication, unspecified whether long term insulin use [facility])   Primary Care at [facility], MD   9 months ago Type 2 diabetes mellitus with other specified complication, unspecified whether long term insulin use [facility])   Primary Care at [facility], MD

## 2022-04-27 ENCOUNTER — Other Ambulatory Visit: Payer: Self-pay | Admitting: Family Medicine

## 2022-04-27 DIAGNOSIS — M47812 Spondylosis without myelopathy or radiculopathy, cervical region: Secondary | ICD-10-CM | POA: Insufficient documentation

## 2022-04-27 MED ORDER — MUPIROCIN 2 % EX OINT: 1.0000 | TOPICAL_OINTMENT | Freq: Two times a day (BID) | CUTANEOUS | 1 refills | Status: DC

## 2022-04-28 LAB — PROTIME-INR: Protime: 2.9 — AB (ref 10.0–13.8)

## 2022-05-04 ENCOUNTER — Other Ambulatory Visit: Payer: Self-pay | Admitting: Family Medicine

## 2022-05-04 MED ORDER — TIRZEPATIDE 5 MG/0.5ML ~~LOC~~ SOAJ: 5.0000 mg | SUBCUTANEOUS | 0 refills | Status: DC

## 2022-05-05 LAB — PROTIME-INR: Protime: 2.3 — AB (ref 10.0–13.8)

## 2022-05-06 ENCOUNTER — Encounter: Payer: Self-pay | Admitting: *Deleted

## 2022-05-06 ENCOUNTER — Other Ambulatory Visit: Payer: Self-pay | Admitting: Family Medicine

## 2022-05-07 ENCOUNTER — Other Ambulatory Visit: Payer: Self-pay | Admitting: Family Medicine

## 2022-05-07 ENCOUNTER — Encounter: Payer: Self-pay | Admitting: Family Medicine

## 2022-05-07 MED ORDER — SPIRONOLACTONE 25 MG PO TABS: 25.0000 mg | ORAL_TABLET | Freq: Every day | ORAL | 0 refills | Status: DC

## 2022-05-07 MED ORDER — ISOSORBIDE MONONITRATE ER 30 MG PO TB24: 30.0000 mg | ORAL_TABLET | Freq: Every day | ORAL | 0 refills | Status: DC

## 2022-05-07 MED ORDER — DAPAGLIFLOZIN PROPANEDIOL 10 MG PO TABS: 10.0000 mg | ORAL_TABLET | Freq: Every day | ORAL | 0 refills | Status: DC

## 2022-05-07 NOTE — Telephone Encounter (Signed)
Requested Prescriptions  Pending Prescriptions Disp Refills   isosorbide mononitrate (IMDUR) 30 MG 24 hr tablet [Pharmacy Med Name: Isosorbide Mononitrate ER 30 MG Oral Tablet Extended Release 24 Hour] 90 tablet 0    Sig: Take 1 tablet by mouth once daily     Cardiovascular:  Nitrates Passed - 05/06/2022  9:38 PM      Passed - Last BP in normal range    BP Readings from Last 1 Encounters:  03/26/22 137/76         Passed - Last Heart Rate in normal range    Pulse Readings from Last 1 Encounters:  03/26/22 84         Passed - Valid encounter within last 12 months    Recent Outpatient Visits           1 month ago Neck pain   Primary Care at [facility], Clyde Canterbury, MD   3 months ago Type 2 diabetes mellitus with other specified complication, unspecified whether long term insulin use ([place])   Primary Care at [facility], MD   3 months ago Pain of left upper arm   Primary Care at [facility], Clyde Canterbury, MD   6 months ago Type 2 diabetes mellitus with other specified complication, unspecified whether long term insulin use ([place])   Primary Care at [facility], MD   10 months ago Type 2 diabetes mellitus with other specified complication, unspecified whether long term insulin use ([place])   Primary Care at [facility], Clyde Canterbury, MD               FARXIGA 10 MG TABS tablet [Pharmacy Med Name: Farxiga 10 MG Oral Tablet] 90 tablet 0    Sig: TAKE 1 TABLET BY [place]     Endocrinology:  Diabetes - SGLT2 Inhibitors Failed - 05/06/2022  9:38 PM      Failed - Cr in normal range and within 360 days    Creatinine, Ser  Date Value Ref Range Status  01/29/2022 1.65 (H) 0.57 - 1.00 mg/dL Final         Failed - HBA1C is between 0 and 7.9 and within 180 days    Hemoglobin A1C  Date Value Ref Range Status  01/29/2022 9.2 (A) 4.0 - 5.6 % Final         Failed - eGFR in normal range and within 360 days    eGFR   Date Value Ref Range Status  01/29/2022 34 (L) >59 mL/min/1.73 Final         Passed - Valid encounter within last 6 months    Recent Outpatient Visits           1 month ago Neck pain   Primary Care at [facility], Clyde Canterbury, MD   3 months ago Type 2 diabetes mellitus with other specified complication, unspecified whether long term insulin use [facility])   Primary Care at [facility], MD   3 months ago Pain of left upper arm   Primary Care at [facility], Clyde Canterbury, MD   6 months ago Type 2 diabetes mellitus with other specified complication, unspecified whether long term insulin use [facility])   Primary Care at [facility], Clyde Canterbury, MD   10 months ago Type 2 diabetes mellitus with other specified complication, unspecified whether long term insulin use [facility])   Primary Care at [facility], MD

## 2022-05-07 NOTE — Telephone Encounter (Signed)
Unable to refill per protocol, Rx expired. Medication was discontinued 01/29/22. Will refuse.  Requested Prescriptions  Pending Prescriptions Disp Refills   FARXIGA 10 MG TABS tablet [Pharmacy Med Name: Farxiga 10 MG Oral Tablet] 90 tablet 0    Sig: TAKE 1 TABLET BY MOUTH ONCE DAILY BEFORE BREAKFAST     Endocrinology:  Diabetes - SGLT2 Inhibitors Failed - 05/06/2022  9:38 PM      Failed - Cr in normal range and within 360 days    Creatinine, Ser  Date Value Ref Range Status  01/29/2022 1.65 (H) 0.57 - 1.00 mg/dL Final         Failed - HBA1C is between 0 and 7.9 and within 180 days    Hemoglobin A1C  Date Value Ref Range Status  01/29/2022 9.2 (A) 4.0 - 5.6 % Final         Failed - eGFR in normal range and within 360 days    eGFR  Date Value Ref Range Status  01/29/2022 34 (L) >59 mL/min/1.73 Final         Passed - Valid encounter within last 6 months    Recent Outpatient Visits           1 month ago Neck pain   Primary Care at [facility], MD   3 months ago Type 2 diabetes mellitus with other specified complication, unspecified whether long term insulin use ([place])   Primary Care at [facility], MD   3 months ago Pain of left upper arm   Primary Care at [facility], Clyde Canterbury, MD   6 months ago Type 2 diabetes mellitus with other specified complication, unspecified whether long term insulin use [facility])   Primary Care at [facility], MD   10 months ago Type 2 diabetes mellitus with other specified complication, unspecified whether long term insulin use [facility])   Primary Care at [facility], MD              Signed Prescriptions Disp Refills   isosorbide mononitrate (IMDUR) 30 MG 24 hr tablet 90 tablet 0    Sig: Take 1 tablet by mouth once daily     Cardiovascular:  Nitrates Passed - 05/06/2022  9:38 PM      Passed - Last BP in normal range    BP Readings from Last 1 Encounters:  03/26/22  137/76         Passed - Last Heart Rate in normal range    Pulse Readings from Last 1 Encounters:  03/26/22 84         Passed - Valid encounter within last 12 months    Recent Outpatient Visits           1 month ago Neck pain   Primary Care at [facility], Clyde Canterbury, MD   3 months ago Type 2 diabetes mellitus with other specified complication, unspecified whether long term insulin use [facility])   Primary Care at [facility], Clyde Canterbury, MD   3 months ago Pain of left upper arm   Primary Care at [facility], Clyde Canterbury, MD   6 months ago Type 2 diabetes mellitus with other specified complication, unspecified whether long term insulin use [facility])   [facility], Clyde Can[facility], MD   10 months ago Type 2 diabetes mellitus with other specified complication, unspecified whether long term insulin use [facility])   Primary Care at [facility], MD

## 2022-05-13 LAB — PROTIME-INR: Protime: 1.8 — AB (ref 10.0–13.8)

## 2022-05-19 LAB — PROTIME-INR: Protime: 2.2 — AB (ref 10.0–13.8)

## 2022-05-26 LAB — PROTIME-INR: Protime: 2.3 — AB (ref 10.0–13.8)

## 2022-05-28 DIAGNOSIS — M542 Cervicalgia: Secondary | ICD-10-CM | POA: Insufficient documentation

## 2022-05-31 ENCOUNTER — Other Ambulatory Visit: Payer: Self-pay | Admitting: Family Medicine

## 2022-06-01 ENCOUNTER — Other Ambulatory Visit: Payer: Self-pay | Admitting: Family Medicine

## 2022-06-01 MED ORDER — TIRZEPATIDE 5 MG/0.5ML ~~LOC~~ SOAJ: 5.0000 mg | SUBCUTANEOUS | 0 refills | Status: DC

## 2022-06-02 ENCOUNTER — Other Ambulatory Visit: Payer: Self-pay | Admitting: Family Medicine

## 2022-06-02 DIAGNOSIS — E875 Hyperkalemia: Secondary | ICD-10-CM | POA: Insufficient documentation

## 2022-06-02 MED ORDER — INSULIN ASPART 100 UNIT/ML FLEXPEN: PEN_INJECTOR | SUBCUTANEOUS | 5 refills | Status: DC

## 2022-06-02 MED ORDER — LANTUS SOLOSTAR 100 UNIT/ML ~~LOC~~ SOPN: PEN_INJECTOR | SUBCUTANEOUS | 0 refills | Status: DC

## 2022-06-03 ENCOUNTER — Other Ambulatory Visit: Payer: Self-pay | Admitting: Family Medicine

## 2022-06-04 ENCOUNTER — Other Ambulatory Visit: Payer: Self-pay | Admitting: Family Medicine

## 2022-06-04 DIAGNOSIS — E1169 Type 2 diabetes mellitus with other specified complication: Secondary | ICD-10-CM

## 2022-06-04 MED ORDER — FREESTYLE LIBRE 2 SENSOR MISC: 1 refills | Status: DC

## 2022-06-12 ENCOUNTER — Ambulatory Visit (INDEPENDENT_AMBULATORY_CARE_PROVIDER_SITE_OTHER): Payer: PPO

## 2022-06-12 ENCOUNTER — Other Ambulatory Visit: Payer: Self-pay | Admitting: Family Medicine

## 2022-06-12 DIAGNOSIS — E1169 Type 2 diabetes mellitus with other specified complication: Secondary | ICD-10-CM

## 2022-06-12 DIAGNOSIS — Z0001 Encounter for general adult medical examination with abnormal findings: Secondary | ICD-10-CM | POA: Diagnosis not present

## 2022-06-12 DIAGNOSIS — Z0000 Encounter for general adult medical examination without abnormal findings: Secondary | ICD-10-CM

## 2022-06-12 LAB — PROTIME-INR: Protime: 2.9 — AB (ref 10.0–13.8)

## 2022-06-12 MED ORDER — BENZONATATE 100 MG PO CAPS: 100.0000 mg | ORAL_CAPSULE | Freq: Two times a day (BID) | ORAL | 0 refills | Status: DC | PRN

## 2022-06-12 MED ORDER — METHYLPREDNISOLONE 4 MG PO TBPK: ORAL_TABLET | ORAL | 0 refills | Status: DC

## 2022-06-12 NOTE — Progress Notes (Signed)
Subjective:   Tonya Evans is a 67 y.o. female who presents for Medicare Annual (Subsequent) preventive examination.  Review of Systems    Per to Pcp Cardiac Risk Factors include: diabetes mellitus     Objective:    There were no vitals filed for this visit. There is no height or weight on file to calculate BMI.     06/12/2022    3:14 PM  Advanced Directives  Does Patient Have a Medical Advance Directive? No    Current Medications (verified) Outpatient Encounter Medications as of 06/12/2022  Medication Sig   mupirocin ointment (BACTROBAN) 2 % Apply 1 Application topically 2 (two) times daily.   spironolactone (ALDACTONE) 50 MG tablet Take 1 tablet by mouth daily.   albuterol (PROVENTIL) (2.5 MG/3ML) 0.083% nebulizer solution Take 2.5 mg by nebulization every 4 (four) hours as needed.   atorvastatin (LIPITOR) 20 MG tablet Take 1 tablet by mouth once daily   atorvastatin (LIPITOR) 40 MG tablet Take 1 tablet by mouth daily.   budesonide (PULMICORT) 0.5 MG/2ML nebulizer solution Take by nebulization daily.   carvedilol (COREG) 25 MG tablet Take 1 tablet (25 mg total) by mouth 2 (two) times daily with a meal.   cetirizine (ZYRTEC) 10 MG tablet Take 1 tablet (10 mg total) by mouth daily.   Cholecalciferol 25 MCG (1000 UT) tablet Take by mouth.   clonazePAM (KLONOPIN) 0.5 MG tablet Take 1 tablet (0.5 mg total) by mouth as needed.   Continuous Blood Gluc Sensor (FREESTYLE LIBRE 2 SENSOR) MISC Place as directed Q14 days to read blood glucose   cyclobenzaprine (FLEXERIL) 5 MG tablet Take 1 tablet (5 mg total) by mouth 3 (three) times daily as needed for muscle spasms.   dapagliflozin propanediol (FARXIGA) 10 MG TABS tablet Take 1 tablet (10 mg total) by mouth daily before breakfast.   digoxin (LANOXIN) 0.125 MG tablet Take 1 tablet (0.125 mg total) by mouth daily.   Docusate Calcium (STOOL SOFTENER PO) Take by mouth. As needed    EQ ALLERGY RELIEF, CETIRIZINE, 10 MG tablet Take 1 tablet  by mouth once daily   ezetimibe (ZETIA) 10 MG tablet TAKE 1 Tablet BY MOUTH ONCE EVERY DAY   furosemide (LASIX) 40 MG tablet Take 2 tablets (80 mg total) by mouth 2 (two) times daily.   gabapentin (NEURONTIN) 600 MG tablet Take by mouth.   hydrALAZINE (APRESOLINE) 25 MG tablet Take 1 tablet by mouth twice daily   insulin aspart (NOVOLOG) 100 UNIT/ML FlexPen Inject 15 units SQ 3 times daily before meals.   isosorbide mononitrate (IMDUR) 30 MG 24 hr tablet Take 1 tablet (30 mg total) by mouth daily.   LANTUS SOLOSTAR 100 UNIT/ML Solostar Pen SMARTSIG:50 Unit(s) SUB-Q Twice Daily   LANTUS SOLOSTAR 100 UNIT/ML Solostar Pen INJECT 50 UNITS SUBCUTANEOUSLY TWICE DAILY   losartan (COZAAR) 100 MG tablet Take 1 tablet by mouth once daily   mometasone-formoterol (DULERA) 200-5 MCG/ACT AERO INHALE 2 PUFFS BY MOUTH TWICE DAILY. RINSE MOUTH AFTER EACH USE   omeprazole (PRILOSEC) 40 MG capsule Take 1 capsule (40 mg total) by mouth daily.   spironolactone (ALDACTONE) 25 MG tablet Take 1 tablet (25 mg total) by mouth daily.   sucralfate (CARAFATE) 1 g tablet Take 1 tablet (1 g total) by mouth 4 (four) times daily -  with meals and at bedtime. (Patient not taking: Reported on 03/26/2022)   tirzepatide [facility]) 5 MG/0.5ML Pen Inject 5 mg into the skin once a week.   triamcinolone  0.1% oint-Eucerin equivalent cream 1:1 mixture Apply topically 2 (two) times daily as needed.   warfarin (COUMADIN) 1 MG tablet Take 1 tablet by mouth once daily   warfarin (COUMADIN) 2 MG tablet Take 1 tablet (2 mg total) by mouth daily. (Patient not taking: Reported on 03/26/2022)   warfarin (COUMADIN) 3 MG tablet Take 1 tablet (3 mg total) by mouth daily. (Patient not taking: Reported on 03/26/2022)   warfarin (COUMADIN) 5 MG tablet Take 1 tablet (5 mg total) by mouth daily.   Facility-Administered Encounter Medications as of 06/12/2022  Medication   0.9 %  sodium chloride infusion   promethazine (PHENERGAN) injection 25 mg     Allergies (verified) Asa-apap-caff buffered, Other, Peanut allergen powder-dnfp, Metformin, Penicillins, Ace inhibitors, Aspirin, Lisinopril, Meclizine, Peanut-containing drug products, and Penicillin g   History: Past Medical History:  Diagnosis Date   A-fib (HCC)    CHF (congestive heart failure) (HCC)    Constipation    History of blood clots 04/20/2005   Hyperlipemia    Hypertension    Obesity    Type II or unspecified type diabetes mellitus without mention of complication, not stated as uncontrolled    Past Surgical History:  Procedure Laterality Date   APPENDECTOMY  1979   CARDIAC DEFIBRILLATOR PLACEMENT  2008   CESAREAN SECTION     x 3    HERNIA REPAIR  2005   TONSILLECTOMY     Family History  Problem Relation Age of Onset   Diabetes Father    Hypertension Mother    Diabetes Brother    Hypertension Sister    Breast cancer Mother    Colon cancer Neg Hx    Social History   Socioeconomic History   Marital status: Legally Separated    Spouse name: Not on file   Number of children: Not on file   Years of education: Not on file   Highest education level: Not on file  Occupational History   Occupation: pastor    Employer: [employer]  Tobacco Use   Smoking status: Former   Smokeless tobacco: Never  Substance and Sexual Activity   Alcohol use: No   Drug use: No   Sexual activity: Not Currently  Other Topics Concern   Not on file  Social History Narrative   No caffeine drinks    Social Determinants of Health   Financial Resource Strain: Low Risk  (06/12/2022)   Overall Financial Resource Strain (CARDIA)    Difficulty of Paying Living Expenses: Not very hard  Food Insecurity: No Food Insecurity (06/12/2022)   Hunger Vital Sign    Worried About Running Out of Food in the Last Year: Never true    Ran Out of Food in the Last Year: Never true  Transportation Needs: No Transportation Needs (06/12/2022)   PRAPARE - Transportation    Lack of  Transportation (Medical): No    Lack of Transportation (Non-Medical): No  Physical Activity: Inactive (06/12/2022)   Exercise Vital Sign    Days of Exercise per Week: 1 day    Minutes of Exercise per Session: 0 min  Stress: No Stress Concern Present (06/12/2022)   [place]    Feeling of Stress : Not at all  Social Connections: Moderately Integrated (06/12/2022)   Social Connection and Isolation Panel [NHANES]    Frequency of Communication with Friends and Family: More than three times a week    Frequency of Social Gatherings with Friends and  Family: More than three times a week    Attends Religious Services: More than 4 times per year    Active Member of Clubs or Organizations: Yes    Attends Archivist Meetings: More than 4 times per year    Marital Status: Separated    Tobacco Counseling Counseling given: Not Answered   Clinical Intake:                 Diabetic?yes         Activities of Daily Living    06/12/2022    3:16 PM  In your present state of health, do you have any difficulty performing the following activities:  Hearing? 0  Vision? 0  Difficulty concentrating or making decisions? 0  Walking or climbing stairs? 0  Dressing or bathing? 0  Doing errands, shopping? 0  Preparing Food and eating ? N  Using the Toilet? N  In the past six months, have you accidently leaked urine? N  Do you have problems with loss of bowel control? N  Managing your Medications? N  Managing your Finances? N  Housekeeping or managing your Housekeeping? N    Patient Care Team: Dorna Mai, MD as PCP - General (Family Medicine)  Indicate any recent Medical Services you may have received from other than [facility] providers in the past year (date may be approximate).     Assessment:   This is a routine wellness examination for Nakina.  Hearing/Vision screen No results found.  Dietary issues and  exercise activities discussed: Exercise limited by: None identified   Goals Addressed   None   Depression Screen    03/26/2022   10:00 AM 10/27/2021   11:08 AM 07/02/2021   11:03 AM 07/02/2021   10:46 AM 12/20/2020    9:52 AM  PHQ 2/9 Scores  PHQ - 2 Score 0 0 1 0 3  PHQ- 9 Score '1 3 5 '$ 0 7    Fall Risk    06/12/2022    3:17 PM  [place] in the past year? 0  Number falls in past yr: 0  Injury with Fall? 0  Risk for fall due to : No Fall Risks    FALL RISK PREVENTION PERTAINING TO THE HOME:  Any stairs in or around the home? Yes  If so, are there any without handrails? No  Home free of loose throw rugs in walkways, pet beds, electrical cords, etc? Yes  Adequate lighting in your home to reduce risk of falls? Yes   ASSISTIVE DEVICES UTILIZED TO PREVENT FALLS:  Life alert? No  Use of a cane, walker or w/c? No  Grab bars in the bathroom? No  Shower chair or bench in shower? No  Elevated toilet seat or a handicapped toilet? No   TIMED UP AND GO:  Was the test performed? No .  Length of time to ambulate 10 feet:  sec.   Gait slow and steady without use of assistive device  Cognitive Function:        06/12/2022    3:07 PM  6CIT Screen  What Year? 4 points  What month? 3 points  What time? 3 points  Count back from 20 0 points  Months in reverse 4 points  Repeat phrase 10 points  Total Score 24 points    Immunizations Immunization History  Administered Date(s) Administered   Influenza Split 02/22/2013   Influenza,inj,Quad PF,6+ Mos 02/06/2014, 01/29/2015, 01/09/2016, 03/04/2017, 01/20/2018, 02/21/2019, 01/25/2020   Influenza,inj,Quad  PF,6-35 Mos 04/18/2021   Influenza-Unspecified 02/22/2013, 02/06/2014, 01/29/2015, 01/09/2016, 03/04/2017   PFIZER(Purple Top)SARS-COV-2 Vaccination 07/14/2019, 08/09/2019   Pneumococcal Polysaccharide-23 02/22/2013, 08/09/2014    TDAP status: Due, Education has been provided regarding the importance of this vaccine.  Advised may receive this vaccine at local pharmacy or Health Dept. Aware to provide a copy of the vaccination record if obtained from local pharmacy or Health Dept. Verbalized acceptance and understanding.  Flu Vaccine status: Declined, Education has been provided regarding the importance of this vaccine but patient still declined. Advised may receive this vaccine at local pharmacy or Health Dept. Aware to provide a copy of the vaccination record if obtained from local pharmacy or Health Dept. Verbalized acceptance and understanding.  Pneumococcal vaccine status: Declined,  Education has been provided regarding the importance of this vaccine but patient still declined. Advised may receive this vaccine at local pharmacy or Health Dept. Aware to provide a copy of the vaccination record if obtained from local pharmacy or Health Dept. Verbalized acceptance and understanding.   Covid-19 vaccine status: Declined, Education has been provided regarding the importance of this vaccine but patient still declined. Advised may receive this vaccine at local pharmacy or Health Dept.or vaccine clinic. Aware to provide a copy of the vaccination record if obtained from local pharmacy or Health Dept. Verbalized acceptance and understanding.  Qualifies for Shingles Vaccine? Yes   Zostavax completed No   Shingrix Completed?: No.    Education has been provided regarding the importance of this vaccine. Patient has been advised to call insurance company to determine out of pocket expense if they have not yet received this vaccine. Advised may also receive vaccine at local pharmacy or Health Dept. Verbalized acceptance and understanding.  Screening Tests Health Maintenance  Topic Date Due   FOOT EXAM  Never done   Diabetic kidney evaluation - Urine ACR  Never done   COVID-19 Vaccine (3 - Pfizer risk series) 06/28/2022 (Originally 09/06/2019)   INFLUENZA VACCINE  07/19/2022 (Originally 11/18/2021)   OPHTHALMOLOGY EXAM   10/18/2022 (Originally [birth year]19/1965)   DEXA SCAN  10/18/2022 (Originally 11/19/2020)   COLONOSCOPY (Pts 45-34yr Insurance coverage will need to be confirmed)  10/18/2022 (Originally 08/26/2020)   Hepatitis C Screening  10/18/2022 (Originally 11/19/1973)   HEMOGLOBIN A1C  07/31/2022   MAMMOGRAM  10/18/2022   Diabetic kidney evaluation - eGFR measurement  01/30/2023   Medicare Annual Wellness (AWV)  06/13/2023   HPV VACCINES  Aged Out   DTaP/Tdap/Td  Discontinued   Pneumonia Vaccine 67 Years old  Discontinued   Zoster Vaccines- Shingrix  Discontinued    Health Maintenance  Health Maintenance Due  Topic Date Due   FOOT EXAM  Never done   Diabetic kidney evaluation - Urine ACR  Never done    Colorectal cancer screening: Type of screening: Colonoscopy. Completed 2012. Repeat every 10 years  Mammogram status: Ordered  . Pt provided with contact info and advised to call to schedule appt.   Bone Density status: Ordered  . Pt provided with contact info and advised to call to schedule appt.  Lung Cancer Screening: (Low Dose CT Chest recommended if Age 67-80years, 30 pack-year currently smoking OR have quit w/in 15years.) does qualify.   Lung Cancer Screening Referral: n/a  Additional Screening:  Hepatitis C Screening: does qualify; Completed n/a  Vision Screening: Recommended annual ophthalmology exams for early detection of glaucoma and other disorders of the eye. Is the patient up to date with their annual eye exam?  No  Who is the provider or what is the name of the office in which the patient attends annual eye exams?  If pt is not established with a provider, would they like to be referred to a provider to establish care? No .   Dental Screening: Recommended annual dental exams for proper oral hygiene  Community Resource Referral / Chronic Care Management: CRR required this visit?  No   CCM required this visit?  No      Plan:     I have personally reviewed and noted the  following in the patient's chart:   Medical and social history Use of alcohol, tobacco or illicit drugs  Current medications and supplements including opioid prescriptions. Patient is not currently taking opioid prescriptions. Functional ability and status Nutritional status Physical activity Advanced directives List of other physicians Hospitalizations, surgeries, and ER visits in previous 12 months Vitals Screenings to include cognitive, depression, and falls Referrals and appointments  In addition, I have reviewed and discussed with patient certain preventive protocols, quality metrics, and best practice recommendations. A written personalized care plan for preventive services as well as general preventive health recommendations were provided to patient.     Melene Plan, RMA   06/12/2022   Nurse Notes: Patient was advise to make appointment with provider for DM follow-up.

## 2022-06-15 ENCOUNTER — Other Ambulatory Visit: Payer: Self-pay | Admitting: Family Medicine

## 2022-06-18 ENCOUNTER — Encounter: Payer: Self-pay | Admitting: Family Medicine

## 2022-06-25 LAB — POCT INR: INR: 2.5 — AB (ref 0.80–1.20)

## 2022-06-29 ENCOUNTER — Other Ambulatory Visit: Payer: Self-pay | Admitting: Family Medicine

## 2022-06-29 MED ORDER — LANTUS SOLOSTAR 100 UNIT/ML ~~LOC~~ SOPN: PEN_INJECTOR | SUBCUTANEOUS | 5 refills | Status: DC

## 2022-07-01 LAB — PROTIME-INR: Protime: 3.8 — AB (ref 10.0–13.8)

## 2022-07-01 LAB — POCT INR: INR: 3.8 — AB (ref 0.80–1.20)

## 2022-07-05 ENCOUNTER — Other Ambulatory Visit: Payer: Self-pay | Admitting: Family Medicine

## 2022-07-10 LAB — POCT INR: INR: 2.6 — AB (ref 0.80–1.20)

## 2022-07-14 LAB — PROTIME-INR: INR: 2.5 — AB (ref 0.80–1.20)

## 2022-07-15 ENCOUNTER — Other Ambulatory Visit: Payer: Self-pay | Admitting: Family Medicine

## 2022-07-15 MED ORDER — BUDESONIDE 0.5 MG/2ML IN SUSP: 0.5000 mg | Freq: Every day | RESPIRATORY_TRACT | 5 refills | Status: DC

## 2022-07-17 ENCOUNTER — Ambulatory Visit (INDEPENDENT_AMBULATORY_CARE_PROVIDER_SITE_OTHER): Payer: PPO | Admitting: Family Medicine

## 2022-07-17 ENCOUNTER — Ambulatory Visit (INDEPENDENT_AMBULATORY_CARE_PROVIDER_SITE_OTHER): Payer: PPO

## 2022-07-17 ENCOUNTER — Encounter: Payer: Self-pay | Admitting: Family Medicine

## 2022-07-17 VITALS — BP 136/67 | HR 82 | Temp 98.1°F | Resp 16 | Wt 298.6 lb

## 2022-07-17 DIAGNOSIS — I129 Hypertensive chronic kidney disease with stage 1 through stage 4 chronic kidney disease, or unspecified chronic kidney disease: Secondary | ICD-10-CM | POA: Diagnosis not present

## 2022-07-17 DIAGNOSIS — Z6841 Body Mass Index (BMI) 40.0 and over, adult: Secondary | ICD-10-CM

## 2022-07-17 DIAGNOSIS — R0602 Shortness of breath: Secondary | ICD-10-CM

## 2022-07-17 DIAGNOSIS — J4520 Mild intermittent asthma, uncomplicated: Secondary | ICD-10-CM | POA: Diagnosis not present

## 2022-07-17 DIAGNOSIS — I509 Heart failure, unspecified: Secondary | ICD-10-CM

## 2022-07-17 DIAGNOSIS — N183 Chronic kidney disease, stage 3 unspecified: Secondary | ICD-10-CM

## 2022-07-17 NOTE — Progress Notes (Signed)
Established Patient Office Visit  Subjective    Patient ID: Tonya Evans, female    DOB: 1955/05/30  Age: 67 y.o. MRN: [id number]  CC: No chief complaint on file.   HPI Tonya Evans presents with complaint of SOB that has been increasing over the past 2 weeks. She has been utilizing her nebulizer and recently increased her dosage of lasix with minimal relief. She initially also had swelling in her legs which have improved.    Outpatient Encounter Medications as of 07/17/2022  Medication Sig   mupirocin ointment (BACTROBAN) 2 % Apply 1 Application topically 2 (two) times daily.   albuterol (PROVENTIL) (2.5 MG/3ML) 0.083% nebulizer solution Take 2.5 mg by nebulization every 4 (four) hours as needed.   atorvastatin (LIPITOR) 20 MG tablet Take 1 tablet by mouth once daily   atorvastatin (LIPITOR) 40 MG tablet Take 1 tablet by mouth daily.   benzonatate (TESSALON) 100 MG capsule Take 1 capsule (100 mg total) by mouth 2 (two) times daily as needed for cough.   budesonide (PULMICORT) 0.5 MG/2ML nebulizer solution Take 2 mLs (0.5 mg total) by nebulization daily.   carvedilol (COREG) 25 MG tablet Take 1 tablet (25 mg total) by mouth 2 (two) times daily with a meal.   cetirizine (ZYRTEC) 10 MG tablet Take 1 tablet (10 mg total) by mouth daily.   Cholecalciferol 25 MCG (1000 UT) tablet Take by mouth.   clonazePAM (KLONOPIN) 0.5 MG tablet Take 1 tablet (0.5 mg total) by mouth as needed.   Continuous Blood Gluc Sensor (FREESTYLE LIBRE 2 SENSOR) MISC Place as directed Q14 days to read blood glucose   cyclobenzaprine (FLEXERIL) 5 MG tablet Take 1 tablet (5 mg total) by mouth 3 (three) times daily as needed for muscle spasms.   dapagliflozin propanediol (FARXIGA) 10 MG TABS tablet Take 1 tablet (10 mg total) by mouth daily before breakfast.   digoxin (LANOXIN) 0.125 MG tablet Take 1 tablet (0.125 mg total) by mouth daily.   Docusate Calcium (STOOL SOFTENER PO) Take by mouth. As needed    EQ ALLERGY  RELIEF, CETIRIZINE, 10 MG tablet Take 1 tablet by mouth once daily   ezetimibe (ZETIA) 10 MG tablet TAKE 1 Tablet BY MOUTH ONCE EVERY DAY   furosemide (LASIX) 40 MG tablet Take 2 tablets (80 mg total) by mouth 2 (two) times daily.   gabapentin (NEURONTIN) 600 MG tablet Take by mouth.   hydrALAZINE (APRESOLINE) 25 MG tablet Take 1 tablet by mouth twice daily   insulin aspart (NOVOLOG) 100 UNIT/ML FlexPen Inject 15 units SQ 3 times daily before meals.   isosorbide mononitrate (IMDUR) 30 MG 24 hr tablet Take 1 tablet (30 mg total) by mouth daily.   LANTUS SOLOSTAR 100 UNIT/ML Solostar Pen SMARTSIG:50 Unit(s) SUB-Q Twice Daily   LANTUS SOLOSTAR 100 UNIT/ML Solostar Pen INJECT 50 UNITS SUBCUTANEOUSLY TWICE DAILY   losartan (COZAAR) 100 MG tablet Take 1 tablet by mouth once daily   methylPREDNISolone (MEDROL DOSEPAK) 4 MG TBPK tablet Take daily as directed   mometasone-formoterol (DULERA) 200-5 MCG/ACT AERO INHALE 2 PUFFS BY MOUTH TWICE DAILY. RINSE MOUTH AFTER EACH USE   omeprazole (PRILOSEC) 40 MG capsule Take 1 capsule (40 mg total) by mouth daily.   spironolactone (ALDACTONE) 25 MG tablet Take 1 tablet (25 mg total) by mouth daily.   spironolactone (ALDACTONE) 50 MG tablet Take 1 tablet by mouth daily.   sucralfate (CARAFATE) 1 g tablet Take 1 tablet (1 g total) by mouth 4 (four)  times daily -  with meals and at bedtime. (Patient not taking: Reported on 03/26/2022)   tirzepatide [facility]) 5 MG/0.5ML Pen Inject 5 mg into the skin once a week.   triamcinolone 0.1% oint-Eucerin equivalent cream 1:1 mixture Apply topically 2 (two) times daily as needed.   warfarin (COUMADIN) 1 MG tablet Take 1 tablet by mouth once daily   warfarin (COUMADIN) 2 MG tablet Take 1 tablet (2 mg total) by mouth daily. (Patient not taking: Reported on 03/26/2022)   warfarin (COUMADIN) 3 MG tablet Take 1 tablet (3 mg total) by mouth daily. (Patient not taking: Reported on 03/26/2022)   warfarin (COUMADIN) 5 MG tablet Take 1  tablet (5 mg total) by mouth daily.   Facility-Administered Encounter Medications as of 07/17/2022  Medication   0.9 %  sodium chloride infusion   promethazine (PHENERGAN) injection 25 mg    Past Medical History:  Diagnosis Date   A-fib (HCC)    CHF (congestive heart failure) (HCC)    Constipation    History of blood clots 04/20/2005   Hyperlipemia    Hypertension    Obesity    Type II or unspecified type diabetes mellitus without mention of complication, not stated as uncontrolled     Past Surgical History:  Procedure Laterality Date   APPENDECTOMY  1979   CARDIAC DEFIBRILLATOR PLACEMENT  2008   CESAREAN SECTION     x 3    HERNIA REPAIR  2005   TONSILLECTOMY      Family History  Problem Relation Age of Onset   Diabetes Father    Hypertension Mother    Diabetes Brother    Hypertension Sister    Breast cancer Mother    Colon cancer Neg Hx     Social History   Socioeconomic History   Marital status: Legally Separated    Spouse name: Not on file   Number of children: Not on file   Years of education: Not on file   Highest education level: Not on file  Occupational History   Occupation: pastor    Employer: [employer]  Tobacco Use   Smoking status: Former   Smokeless tobacco: Never  Substance and Sexual Activity   Alcohol use: No   Drug use: No   Sexual activity: Not Currently  Other Topics Concern   Not on file  Social History Narrative   No caffeine drinks    Social Determinants of Health   Financial Resource Strain: Low Risk  (06/12/2022)   Overall Financial Resource Strain (CARDIA)    Difficulty of Paying Living Expenses: Not very hard  Food Insecurity: No Food Insecurity (06/12/2022)   Hunger Vital Sign    Worried About Running Out of Food in the Last Year: Never true    Ran Out of Food in the Last Year: Never true  Transportation Needs: No Transportation Needs (06/12/2022)   PRAPARE - Transportation    Lack of Transportation (Medical): No     Lack of Transportation (Non-Medical): No  Physical Activity: Inactive (06/12/2022)   Exercise Vital Sign    Days of Exercise per Week: 1 day    Minutes of Exercise per Session: 0 min  Stress: No Stress Concern Present (06/12/2022)   [place]    Feeling of Stress : Not at all  Social Connections: Moderately Integrated (06/12/2022)   Social Connection and Isolation Panel [NHANES]    Frequency of Communication with Friends and Family: More than three  times a week    Frequency of Social Gatherings with Friends and Family: More than three times a week    Attends Religious Services: More than 4 times per year    Active Member of [employer] or Organizations: Yes    Attends Archivist Meetings: More than 4 times per year    Marital Status: Separated  Intimate Partner Violence: Not At Risk (06/12/2022)   Humiliation, Afraid, Rape, and Kick questionnaire    Fear of Current or Ex-Partner: No    Emotionally Abused: No    Physically Abused: No    Sexually Abused: No    Review of Systems  Constitutional:  Negative for chills and fever.  Respiratory:  Positive for cough, shortness of breath and wheezing.   Cardiovascular:  Positive for chest pain and leg swelling. Negative for palpitations.  All other systems reviewed and are negative.       Objective    BP 136/67   Pulse 82   Temp 98.1 F (36.7 C) (Oral)   Resp 16   Wt 298 lb 9.6 oz (135.4 kg)   SpO2 96%   BMI 48.93 kg/m   Physical Exam Vitals and nursing note reviewed.  Constitutional:      General: She is not in acute distress.    Appearance: She is obese.  Cardiovascular:     Rate and Rhythm: Normal rate and regular rhythm.  Pulmonary:     Effort: Pulmonary effort is normal. No respiratory distress.     Breath sounds: Wheezing present.  Abdominal:     Palpations: Abdomen is soft.     Tenderness: There is no abdominal tenderness.  Musculoskeletal:      Right lower leg: No edema.     Left lower leg: No edema.  Neurological:     General: No focal deficit present.     Mental Status: She is alert and oriented to person, place, and time.         Assessment & Plan:   1. SOB (shortness of breath) Clinically believe it is acute heart failure as opposed to RAD. CXR ordered - DG Chest 2 View; Future  2. Acute on chronic heart failure, unspecified heart failure type ([place]) As above  3. Benign hypertension with chronic kidney disease, stage III (HCC) Appears stable continue  4. Mild intermittent chronic asthma without complication Continue maintenance  5. Class 3 severe obesity due to excess calories with serious comorbidity and body mass index (BMI) of 45.0 to 49.9 in adult ([place]) Weight gain most likely 2/2 fluid overload   No follow-ups on file.   Becky Sax, MD

## 2022-07-21 ENCOUNTER — Encounter: Payer: Self-pay | Admitting: Family Medicine

## 2022-07-21 ENCOUNTER — Other Ambulatory Visit: Payer: Self-pay | Admitting: Family Medicine

## 2022-07-21 LAB — PROTIME-INR: Protime: 3.3 — AB (ref 10.0–13.8)

## 2022-07-21 LAB — POCT INR: INR: 3.3 — AB (ref 0.80–1.20)

## 2022-07-24 ENCOUNTER — Other Ambulatory Visit: Payer: Self-pay | Admitting: Family Medicine

## 2022-07-24 MED ORDER — EZETIMIBE 10 MG PO TABS: ORAL_TABLET | ORAL | 3 refills | Status: DC

## 2022-07-27 DIAGNOSIS — R058 Other specified cough: Secondary | ICD-10-CM | POA: Insufficient documentation

## 2022-07-28 ENCOUNTER — Other Ambulatory Visit: Payer: Self-pay | Admitting: Family Medicine

## 2022-07-28 MED ORDER — DIPHENOXYLATE-ATROPINE 2.5-0.025 MG PO TABS: 2.0000 | ORAL_TABLET | Freq: Four times a day (QID) | ORAL | 1 refills | Status: DC | PRN

## 2022-07-28 MED ORDER — ONDANSETRON HCL 4 MG PO TABS: 4.0000 mg | ORAL_TABLET | Freq: Three times a day (TID) | ORAL | 1 refills | Status: DC | PRN

## 2022-07-30 ENCOUNTER — Other Ambulatory Visit: Payer: Self-pay | Admitting: Family Medicine

## 2022-07-30 MED ORDER — FUROSEMIDE 40 MG PO TABS: 80.0000 mg | ORAL_TABLET | Freq: Two times a day (BID) | ORAL | 1 refills | Status: DC

## 2022-07-31 ENCOUNTER — Other Ambulatory Visit: Payer: Self-pay | Admitting: Family Medicine

## 2022-08-05 LAB — PROTIME-INR: Protime: 3.7 — AB (ref 10.0–13.8)

## 2022-08-05 LAB — POCT INR: INR: 3.7 — AB (ref 0.80–1.20)

## 2022-08-11 LAB — PROTIME-INR: Protime: 3.8 — AB (ref 10.0–13.8)

## 2022-08-12 ENCOUNTER — Encounter: Payer: Self-pay | Admitting: Family Medicine

## 2022-08-13 ENCOUNTER — Other Ambulatory Visit: Payer: Self-pay | Admitting: Family Medicine

## 2022-08-14 ENCOUNTER — Other Ambulatory Visit: Payer: Self-pay | Admitting: Family Medicine

## 2022-08-14 DIAGNOSIS — E1169 Type 2 diabetes mellitus with other specified complication: Secondary | ICD-10-CM

## 2022-08-14 MED ORDER — CARVEDILOL 25 MG PO TABS: 25.0000 mg | ORAL_TABLET | Freq: Two times a day (BID) | ORAL | 1 refills | Status: DC

## 2022-08-14 MED ORDER — FREESTYLE LIBRE 2 SENSOR MISC: 3 refills | Status: DC

## 2022-08-14 MED ORDER — ISOSORBIDE MONONITRATE ER 30 MG PO TB24: 30.0000 mg | ORAL_TABLET | Freq: Every day | ORAL | 1 refills | Status: DC

## 2022-08-14 MED ORDER — CETIRIZINE HCL 10 MG PO TABS: 10.0000 mg | ORAL_TABLET | Freq: Every day | ORAL | 1 refills | Status: DC

## 2022-08-16 ENCOUNTER — Other Ambulatory Visit: Payer: Self-pay | Admitting: Family Medicine

## 2022-08-20 ENCOUNTER — Other Ambulatory Visit: Payer: Self-pay | Admitting: Family Medicine

## 2022-08-20 LAB — POCT INR: INR: 2.9 — AB (ref 0.80–1.20)

## 2022-08-21 NOTE — Telephone Encounter (Signed)
Requested medication (s) are due for refill today - yes  Requested medication (s) are on the active medication list -yes  Future visit scheduled -no  Last refill: 05/07/22 #90  Notes to clinic: 2 doses listed on current medication list- this may be current dosing- but please review   Requested Prescriptions  Pending Prescriptions Disp Refills   spironolactone (ALDACTONE) 25 MG tablet [Pharmacy Med Name: Spironolactone 25 MG Oral Tablet] 90 tablet 0    Sig: Take 1 tablet by mouth once daily     Cardiovascular: Diuretics - Aldosterone Antagonist Failed - 08/20/2022  6:20 PM      Failed - Cr in normal range and within 180 days    Creatinine, Ser  Date Value Ref Range Status  01/29/2022 1.65 (H) 0.57 - 1.00 mg/dL Final         Failed - K in normal range and within 180 days    Potassium  Date Value Ref Range Status  01/29/2022 4.1 3.5 - 5.2 mmol/L Final         Failed - Na in normal range and within 180 days    Sodium  Date Value Ref Range Status  01/29/2022 141 134 - 144 mmol/L Final         Failed - eGFR is 30 or above and within 180 days    eGFR  Date Value Ref Range Status  01/29/2022 34 (L) >59 mL/min/1.73 Final         Passed - Last BP in normal range    BP Readings from Last 1 Encounters:  07/17/22 136/67         Passed - Valid encounter within last 6 months    Recent Outpatient Visits           1 month ago SOB (shortness of breath)   [place] [facility], MD   4 months ago Neck pain   [place] [facility], MD   6 months ago Type 2 diabetes mellitus with other specified complication, unspecified whether long term insulin use (HCC)   [place] [facility], MD   7 months ago Pain of left upper arm   [place] [facility] Lauris [facility], MD   9 months ago Type 2 diabetes mellitus with other specified complication, unspecified whether  long term insulin use (HCC)   [place] [facility] at [facility], MD                 Requested Prescriptions  Pending Prescriptions Disp Refills   spironolactone (ALDACTONE) 25 MG tablet [Pharmacy Med Name: Spironolactone 25 MG Oral Tablet] 90 tablet 0    Sig: Take 1 tablet by mouth once daily     Cardiovascular: Diuretics - Aldosterone Antagonist Failed - 08/20/2022  6:20 PM      Failed - Cr in normal range and within 180 days    Creatinine, Ser  Date Value Ref Range Status  01/29/2022 1.65 (H) 0.57 - 1.00 mg/dL Final         Failed - K in normal range and within 180 days    Potassium  Date Value Ref Range Status  01/29/2022 4.1 3.5 - 5.2 mmol/L Final         Failed - Na in normal range and within 180 days    Sodium  Date Value Ref Range Status  01/29/2022 141 134 -  144 mmol/L Final         Failed - eGFR is 30 or above and within 180 days    eGFR  Date Value Ref Range Status  01/29/2022 34 (L) >59 mL/min/1.73 Final         Passed - Last BP in normal range    BP Readings from Last 1 Encounters:  07/17/22 136/67         Passed - Valid encounter within last 6 months    Recent Outpatient Visits           1 month ago SOB (shortness of breath)   [place] [facility], MD   4 months ago Neck pain   [place] [facility], Lauris Poag, MD   6 months ago Type 2 diabetes mellitus with other specified complication, unspecified whether long term insulin use (HCC)   [place] [facility], Laur[facility] Po[facility], MD   7 months ago Pain of left upper arm   [place] [facility] at [facility], Lauris Poag, MD   9 months ago Type 2 diabetes mellitus with other specified complication, unspecified whether long term insulin use [facility])   [place] [facility] at [facility], MD

## 2022-08-24 ENCOUNTER — Other Ambulatory Visit: Payer: Self-pay | Admitting: Family Medicine

## 2022-09-01 ENCOUNTER — Ambulatory Visit (INDEPENDENT_AMBULATORY_CARE_PROVIDER_SITE_OTHER): Payer: PPO | Admitting: Family Medicine

## 2022-09-01 ENCOUNTER — Encounter: Payer: Self-pay | Admitting: Family Medicine

## 2022-09-01 DIAGNOSIS — M79671 Pain in right foot: Secondary | ICD-10-CM | POA: Insufficient documentation

## 2022-09-01 DIAGNOSIS — M79672 Pain in left foot: Secondary | ICD-10-CM | POA: Insufficient documentation

## 2022-09-01 DIAGNOSIS — R053 Chronic cough: Secondary | ICD-10-CM | POA: Diagnosis not present

## 2022-09-01 DIAGNOSIS — M109 Gout, unspecified: Secondary | ICD-10-CM | POA: Insufficient documentation

## 2022-09-01 DIAGNOSIS — M79641 Pain in right hand: Secondary | ICD-10-CM | POA: Insufficient documentation

## 2022-09-01 NOTE — Progress Notes (Signed)
Virtual Visit via Telephone Note  I connected with Tonya Evans on 09/01/22 at  4:00 PM EDT by telephone and verified that I am speaking with the correct person using two identifiers.  Location: Patient: [name] Provider: [place]   I discussed the limitations, risks, security and privacy concerns of performing an evaluation and management service by telephone and the availability of in person appointments. I also discussed with the patient that there may be a patient responsible charge related to this service. The patient expressed understanding and agreed to proceed.   History of Present Illness: Patient reports cough for about 2+ months. Cough is nonproductive. Denies fever/chills. Cough is harsh and episodic, occurring every day. She has had visits with pulmonary recently but has not improved.   Observations/Objective:   Assessment and Plan: 1. Persistent cough Referral for chest CT - CT Chest Wo Contrast; Future  Follow Up Instructions:    I discussed the assessment and treatment plan with the patient. The patient was provided an opportunity to ask questions and all were answered. The patient agreed with the plan and demonstrated an understanding of the instructions.   The patient was advised to call back or seek an in-person evaluation if the symptoms worsen or if the condition fails to improve as anticipated.  I provided 5 minutes of non-face-to-face time during this encounter.   Tommie Raymond, MD

## 2022-09-02 ENCOUNTER — Other Ambulatory Visit: Payer: Self-pay | Admitting: Family Medicine

## 2022-09-02 DIAGNOSIS — R053 Chronic cough: Secondary | ICD-10-CM

## 2022-09-03 ENCOUNTER — Other Ambulatory Visit: Payer: Self-pay | Admitting: Family Medicine

## 2022-09-03 LAB — POCT INR: INR: 3.3 — AB (ref 0.80–1.20)

## 2022-09-03 MED ORDER — ALLOPURINOL 100 MG PO TABS: 50.0000 mg | ORAL_TABLET | Freq: Every day | ORAL | 0 refills | Status: DC

## 2022-09-03 MED ORDER — METHYLPREDNISOLONE 4 MG PO TBPK: ORAL_TABLET | ORAL | 0 refills | Status: DC

## 2022-09-04 ENCOUNTER — Other Ambulatory Visit: Payer: Self-pay | Admitting: Family Medicine

## 2022-09-07 ENCOUNTER — Other Ambulatory Visit: Payer: Self-pay | Admitting: Family Medicine

## 2022-09-15 ENCOUNTER — Other Ambulatory Visit: Payer: Self-pay | Admitting: Family Medicine

## 2022-09-15 MED ORDER — METHYLPREDNISOLONE 2 MG PO TABS: 2.0000 mg | ORAL_TABLET | Freq: Every day | ORAL | 1 refills | Status: DC

## 2022-09-16 ENCOUNTER — Other Ambulatory Visit: Payer: Self-pay | Admitting: Family Medicine

## 2022-09-16 MED ORDER — METHYLPREDNISOLONE 2 MG PO TABS: 2.0000 mg | ORAL_TABLET | Freq: Every day | ORAL | 1 refills | Status: DC

## 2022-09-16 MED ORDER — TIRZEPATIDE 7.5 MG/0.5ML ~~LOC~~ SOAJ
7.5000 mg | SUBCUTANEOUS | 0 refills | Status: DC
Start: 2022-09-16 — End: 2022-12-04

## 2022-09-19 ENCOUNTER — Other Ambulatory Visit: Payer: Self-pay | Admitting: Family Medicine

## 2022-09-21 NOTE — Telephone Encounter (Signed)
Requested Prescriptions  Pending Prescriptions Disp Refills   digoxin (LANOXIN) 0.125 MG tablet [Pharmacy Med Name: Digoxin 125 MCG Oral Tablet] 90 tablet 0    Sig: Take 1 tablet by mouth once daily     Cardiovascular:  Antiarrhythmic Agents - digoxin Failed - 09/19/2022  6:52 AM      Failed - Cr in normal range and within 180 days    Creatinine, Ser  Date Value Ref Range Status  01/29/2022 1.65 (H) 0.57 - 1.00 mg/dL Final         Failed - Digoxin (serum) in normal range and within 360 days    No results found for: "DIGOXIN"       Failed - K in normal range and within 180 days    Potassium  Date Value Ref Range Status  01/29/2022 4.1 3.5 - 5.2 mmol/L Final         Failed - Mg Level in normal range and within 360 days    No results found for: "MG"       Failed - Patient had ECG in the last 360 days      Passed - Ca in normal range and within 360 days    Calcium  Date Value Ref Range Status  01/29/2022 9.0 8.7 - 10.3 mg/dL Final         Passed - Last Heart Rate in normal range    Pulse Readings from Last 1 Encounters:  07/17/22 82         Passed - Valid encounter within last 6 months    Recent Outpatient Visits           2 weeks ago Persistent cough   [place] [facility], MD   2 months ago SOB (shortness of breath)   [place] [facility] at [facility], MD   5 months ago Neck pain   [place] [facility] at [facility] Lauris Poag, MD   7 months ago Type 2 diabetes mellitus with other specified complication, unspecified whether long term insulin use (HCC)   [place] [facility] at [facility], Lauris Poag, MD   8 months ago Pain of left upper arm   [place] [facility] at [facility], MD

## 2022-09-22 ENCOUNTER — Encounter: Payer: Self-pay | Admitting: Family Medicine

## 2022-09-27 LAB — POCT INR
INR: 2.9 — AB (ref 0.80–1.20)
INR: 2.9 — AB (ref 0.80–1.20)

## 2022-09-27 LAB — PROTIME-INR: Protime: 2.9 — AB (ref 10.0–13.8)

## 2022-09-29 ENCOUNTER — Encounter: Payer: Self-pay | Admitting: Family Medicine

## 2022-10-01 ENCOUNTER — Other Ambulatory Visit: Payer: Self-pay | Admitting: Family Medicine

## 2022-10-01 MED ORDER — GABAPENTIN 600 MG PO TABS: 600.0000 mg | ORAL_TABLET | Freq: Two times a day (BID) | ORAL | 1 refills | Status: DC

## 2022-10-01 MED ORDER — WARFARIN SODIUM 5 MG PO TABS: 5.0000 mg | ORAL_TABLET | Freq: Every day | ORAL | 1 refills | Status: DC

## 2022-10-01 MED ORDER — CETIRIZINE HCL 10 MG PO TABS: 10.0000 mg | ORAL_TABLET | Freq: Two times a day (BID) | ORAL | 0 refills | Status: DC

## 2022-10-07 LAB — POCT INR: INR: 3.4 — AB (ref 0.80–1.20)

## 2022-10-08 ENCOUNTER — Other Ambulatory Visit: Payer: Self-pay | Admitting: Family Medicine

## 2022-10-16 ENCOUNTER — Other Ambulatory Visit: Payer: Self-pay | Admitting: Family Medicine

## 2022-10-16 LAB — POCT INR: INR: 3.7 — AB (ref 0.80–1.20)

## 2022-10-27 LAB — POCT INR: INR: 2.4 — AB (ref 0.80–1.20)

## 2022-11-10 ENCOUNTER — Other Ambulatory Visit: Payer: Self-pay | Admitting: Family Medicine

## 2022-11-10 MED ORDER — SPIRONOLACTONE 25 MG PO TABS: 25.0000 mg | ORAL_TABLET | Freq: Every day | ORAL | 1 refills | Status: DC

## 2022-11-10 MED ORDER — CYCLOBENZAPRINE HCL 5 MG PO TABS: 5.0000 mg | ORAL_TABLET | Freq: Three times a day (TID) | ORAL | 1 refills | Status: DC | PRN

## 2022-11-11 LAB — POCT INR: INR: 1.9 — AB (ref 0.80–1.20)

## 2022-11-20 ENCOUNTER — Encounter: Payer: Self-pay | Admitting: Family Medicine

## 2022-11-25 LAB — POCT INR: INR: 2.7 — AB (ref 0.80–1.20)

## 2022-11-27 ENCOUNTER — Other Ambulatory Visit: Payer: Self-pay | Admitting: Family Medicine

## 2022-11-30 ENCOUNTER — Encounter: Payer: Self-pay | Admitting: *Deleted

## 2022-12-04 ENCOUNTER — Other Ambulatory Visit: Payer: Self-pay | Admitting: Family Medicine

## 2022-12-07 ENCOUNTER — Other Ambulatory Visit: Payer: Self-pay | Admitting: Family Medicine

## 2022-12-07 MED ORDER — HYDRALAZINE HCL 25 MG PO TABS: 25.0000 mg | ORAL_TABLET | Freq: Two times a day (BID) | ORAL | 1 refills | Status: DC

## 2022-12-10 LAB — POCT INR: INR: 3.4 — AB (ref 0.80–1.20)

## 2022-12-21 ENCOUNTER — Other Ambulatory Visit: Payer: Self-pay | Admitting: Family Medicine

## 2022-12-21 LAB — POCT INR: INR: 2.9 — AB (ref 0.80–1.20)

## 2022-12-28 ENCOUNTER — Other Ambulatory Visit: Payer: Self-pay | Admitting: Family Medicine

## 2022-12-29 DIAGNOSIS — G459 Transient cerebral ischemic attack, unspecified: Secondary | ICD-10-CM | POA: Insufficient documentation

## 2022-12-30 ENCOUNTER — Telehealth: Payer: Self-pay

## 2022-12-30 NOTE — Transitions of Care (Post Inpatient/ED Visit) (Unsigned)
   12/30/2022  Name: Tonya Evans MRN: [id number] DOB: May 06, 1955  Today's TOC FU Call Status: Today's TOC FU Call Status:: Unsuccessful Call (2nd Attempt) Unsuccessful Call (1st Attempt) Date: 12/30/22 Unsuccessful Call (2nd Attempt) Date: 12/30/22  Attempted to reach the patient regarding the most recent Inpatient/ED visit.  Follow Up Plan: Additional outreach attempts will be made to reach the patient to complete the Transitions of Care (Post Inpatient/ED visit) call.   Signature Karena Addison, LPN [facility] Nurse Health Advisor Direct Dial [phone]

## 2022-12-30 NOTE — Transitions of Care (Post Inpatient/ED Visit) (Unsigned)
   12/30/2022  Name: LENEA CHERY MRN: [id number] DOB: 1955/06/04  Today's TOC FU Call Status: Today's TOC FU Call Status:: Unsuccessful Call (1st Attempt) Unsuccessful Call (1st Attempt) Date: 12/30/22  Attempted to reach the patient regarding the most recent Inpatient/ED visit.  Follow Up Plan: Additional outreach attempts will be made to reach the patient to complete the Transitions of Care (Post Inpatient/ED visit) call.   Signature  Karena Addison, LPN [facility] Nurse Health Advisor Direct Dial [phone]

## 2022-12-31 NOTE — Transitions of Care (Post Inpatient/ED Visit) (Signed)
   12/31/2022  Name: Tonya Evans MRN: [id number] DOB: 23-Apr-1955  Today's TOC FU Call Status: Today's TOC FU Call Status:: Unsuccessful Call (3rd Attempt) Unsuccessful Call (1st Attempt) Date: 12/30/22 Unsuccessful Call (2nd Attempt) Date: 12/30/22 Unsuccessful Call (3rd Attempt) Date: 12/31/22  Attempted to reach the patient regarding the most recent Inpatient/ED visit.  Follow Up Plan: No further outreach attempts will be made at this time. We have been unable to contact the patient.  Signature Karena Addison, LPN [facility] Nurse Health Advisor Direct Dial [phone]

## 2023-01-04 ENCOUNTER — Ambulatory Visit (INDEPENDENT_AMBULATORY_CARE_PROVIDER_SITE_OTHER): Payer: PPO | Admitting: Family Medicine

## 2023-01-04 ENCOUNTER — Encounter: Payer: Self-pay | Admitting: Family Medicine

## 2023-01-04 VITALS — BP 127/80 | HR 80 | Temp 97.8°F | Resp 16 | Ht 63.5 in | Wt 283.2 lb

## 2023-01-04 DIAGNOSIS — Z7985 Long-term (current) use of injectable non-insulin antidiabetic drugs: Secondary | ICD-10-CM | POA: Diagnosis not present

## 2023-01-04 DIAGNOSIS — Z23 Encounter for immunization: Secondary | ICD-10-CM | POA: Diagnosis not present

## 2023-01-04 DIAGNOSIS — Z1231 Encounter for screening mammogram for malignant neoplasm of breast: Secondary | ICD-10-CM

## 2023-01-04 DIAGNOSIS — Z1211 Encounter for screening for malignant neoplasm of colon: Secondary | ICD-10-CM

## 2023-01-04 DIAGNOSIS — Z8673 Personal history of transient ischemic attack (TIA), and cerebral infarction without residual deficits: Secondary | ICD-10-CM | POA: Diagnosis not present

## 2023-01-04 DIAGNOSIS — E1169 Type 2 diabetes mellitus with other specified complication: Secondary | ICD-10-CM | POA: Diagnosis not present

## 2023-01-04 DIAGNOSIS — E119 Type 2 diabetes mellitus without complications: Secondary | ICD-10-CM

## 2023-01-04 DIAGNOSIS — Z09 Encounter for follow-up examination after completed treatment for conditions other than malignant neoplasm: Secondary | ICD-10-CM

## 2023-01-04 DIAGNOSIS — Z7984 Long term (current) use of oral hypoglycemic drugs: Secondary | ICD-10-CM

## 2023-01-04 LAB — POCT GLYCOSYLATED HEMOGLOBIN (HGB A1C): Hemoglobin A1C: 7.5 % — AB (ref 4.0–5.6)

## 2023-01-04 MED ORDER — TIRZEPATIDE 10 MG/0.5ML ~~LOC~~ SOAJ: 10.0000 mg | SUBCUTANEOUS | 1 refills | Status: DC

## 2023-01-05 ENCOUNTER — Encounter: Payer: Self-pay | Admitting: Family Medicine

## 2023-01-05 NOTE — Progress Notes (Signed)
Established Patient Office Visit  Subjective    Patient ID: Tonya Evans, female    DOB: 1955-07-31  Age: 67 y.o. MRN: [id number]  CC:  Chief Complaint  Patient presents with   Follow-up    Follow up from a hospital stay    HPI Tonya Evans presents for routine hospital discharge follow up where she was admitted for TIA. She reports improvement since discharge.   Outpatient Encounter Medications as of 01/04/2023  Medication Sig   albuterol (PROVENTIL) (2.5 MG/3ML) 0.083% nebulizer solution Take 2.5 mg by nebulization every 4 (four) hours as needed.   albuterol (VENTOLIN HFA) 108 (90 Base) MCG/ACT inhaler INHALE 2 PUFFS BY MOUTH EVERY 6 HOURS AS NEEDED FOR COUGHING, WHEEZING, OR SHORTNESS OF BREATH   allopurinol (ZYLOPRIM) 100 MG tablet Take 0.5 tablets (50 mg total) by mouth daily.   apixaban (ELIQUIS) 5 MG TABS tablet Take by mouth.   aspirin 81 MG chewable tablet Chew by mouth.   atorvastatin (LIPITOR) 20 MG tablet Take 1 tablet by mouth once daily   atorvastatin (LIPITOR) 40 MG tablet Take 1 tablet by mouth daily.   benzonatate (TESSALON) 100 MG capsule Take 1 capsule (100 mg total) by mouth 2 (two) times daily as needed for cough.   budesonide (PULMICORT) 0.5 MG/2ML nebulizer solution Take 2 mLs (0.5 mg total) by nebulization daily.   carvedilol (COREG) 25 MG tablet Take 1 tablet (25 mg total) by mouth 2 (two) times daily with a meal.   cetirizine (ZYRTEC) 10 MG tablet Take 1 tablet (10 mg total) by mouth 2 (two) times daily.   Cholecalciferol 25 MCG (1000 UT) tablet Take by mouth.   clonazePAM (KLONOPIN) 0.5 MG tablet Take 1 tablet (0.5 mg total) by mouth as needed.   Continuous Glucose Sensor (FREESTYLE LIBRE 2 SENSOR) MISC Place as directed Q14 days to read blood glucose   cyclobenzaprine (FLEXERIL) 5 MG tablet Take 1 tablet (5 mg total) by mouth 3 (three) times daily as needed for muscle spasms.   dapagliflozin propanediol (FARXIGA) 10 MG TABS tablet Take 1 tablet (10 mg  total) by mouth daily before breakfast.   digoxin (LANOXIN) 0.125 MG tablet Take 1 tablet by mouth once daily   diphenoxylate-atropine (LOMOTIL) 2.5-0.025 MG tablet Take 2 tablets by mouth 4 (four) times daily as needed for diarrhea or loose stools.   Docusate Calcium (STOOL SOFTENER PO) Take by mouth. As needed    ezetimibe (ZETIA) 10 MG tablet TAKE 1 Tablet BY MOUTH ONCE EVERY DAY   furosemide (LASIX) 40 MG tablet Take 2 tablets (80 mg total) by mouth 2 (two) times daily.   gabapentin (NEURONTIN) 600 MG tablet Take 1 tablet (600 mg total) by mouth 2 (two) times daily.   hydrALAZINE (APRESOLINE) 25 MG tablet Take 1 tablet (25 mg total) by mouth 2 (two) times daily.   insulin aspart (NOVOLOG) 100 UNIT/ML FlexPen Inject 15 units SQ 3 times daily before meals.   iron polysaccharides (NIFEREX) 150 MG capsule Take by mouth.   isosorbide mononitrate (IMDUR) 30 MG 24 hr tablet Take 1 tablet (30 mg total) by mouth daily.   LANTUS SOLOSTAR 100 UNIT/ML Solostar Pen SMARTSIG:50 Unit(s) SUB-Q Twice Daily   LANTUS SOLOSTAR 100 UNIT/ML Solostar Pen INJECT 50 UNITS SUBCUTANEOUSLY TWICE DAILY   losartan (COZAAR) 50 MG tablet Take 1 tablet by mouth daily.   methocarbamol (ROBAXIN) 500 MG tablet as needed.   methylPREDNISolone (MEDROL DOSEPAK) 4 MG TBPK tablet Take daily as directed  methylPREDNISolone (MEDROL) 2 MG tablet Take 1 tablet (2 mg total) by mouth daily.   mometasone-formoterol (DULERA) 200-5 MCG/ACT AERO INHALE 2 PUFFS BY MOUTH TWICE DAILY. RINSE MOUTH AFTER EACH USE   mupirocin ointment (BACTROBAN) 2 % Apply 1 Application topically 2 (two) times daily.   omeprazole (PRILOSEC) 40 MG capsule Take 1 capsule (40 mg total) by mouth daily.   ondansetron (ZOFRAN) 4 MG tablet Take 1 tablet (4 mg total) by mouth every 8 (eight) hours as needed for nausea or vomiting.   spironolactone (ALDACTONE) 25 MG tablet Take 1 tablet (25 mg total) by mouth daily.   spironolactone (ALDACTONE) 50 MG tablet Take 1 tablet  by mouth daily.   tirzepatide (MOUNJARO) 10 MG/0.5ML Pen Inject 10 mg into the skin once a week.   triamcinolone 0.1% oint-Eucerin equivalent cream 1:1 mixture Apply topically 2 (two) times daily as needed.   warfarin (COUMADIN) 1 MG tablet Take 1 tablet by mouth daily.   warfarin (COUMADIN) 2 MG tablet Take 7 mg on Sunday , Tuesday, Thursday, and Saturday and 8 mg on Monday, Wednesday and Friday   warfarin (COUMADIN) 3 MG tablet Take 1 tablet by mouth daily.   [DISCONTINUED] losartan (COZAAR) 100 MG tablet Take 1 tablet by mouth once daily   [DISCONTINUED] sucralfate (CARAFATE) 1 g tablet Take 1 tablet (1 g total) by mouth 4 (four) times daily -  with meals and at bedtime.   [DISCONTINUED] tirzepatide (MOUNJARO) 5 MG/0.5ML Pen INJECT 1/2 (ONE-HALF) ML  ONCE A WEEK   [DISCONTINUED] tirzepatide (MOUNJARO) 7.5 MG/0.5ML Pen INJECT 7.5 MG SUBCUTANEOUSLY ONCE A WEEK   acetaminophen (TYLENOL) 500 MG tablet Take by mouth.   tirzepatide (MOUNJARO) 7.5 MG/0.5ML Pen Inject into the skin.   [DISCONTINUED] warfarin (COUMADIN) 1 MG tablet Take 1 tablet by mouth once daily (Patient not taking: Reported on 01/04/2023)   [DISCONTINUED] warfarin (COUMADIN) 2 MG tablet Take 1 tablet (2 mg total) by mouth daily. (Patient not taking: Reported on 01/04/2023)   [DISCONTINUED] warfarin (COUMADIN) 3 MG tablet Take 1 tablet (3 mg total) by mouth daily. (Patient not taking: Reported on 01/04/2023)   [DISCONTINUED] warfarin (COUMADIN) 5 MG tablet Take 1 tablet (5 mg total) by mouth daily. (Patient not taking: Reported on 01/04/2023)   Facility-Administered Encounter Medications as of 01/04/2023  Medication   0.9 %  sodium chloride infusion   promethazine (PHENERGAN) injection 25 mg    Past Medical History:  Diagnosis Date   A-fib (HCC)    CHF (congestive heart failure) (HCC)    Constipation    History of blood clots 04/20/2005   Hyperlipemia    Hypertension    Obesity    Type II or unspecified type diabetes mellitus  without mention of complication, not stated as uncontrolled     Past Surgical History:  Procedure Laterality Date   APPENDECTOMY  1979   CARDIAC DEFIBRILLATOR PLACEMENT  2008   CESAREAN SECTION     x 3    HERNIA REPAIR  2005   TONSILLECTOMY      Family History  Problem Relation Age of Onset   Diabetes Father    Hypertension Mother    Diabetes Brother    Hypertension Sister    Breast cancer Mother    Colon cancer Neg Hx     Social History   Socioeconomic History   Marital status: Legally Separated    Spouse name: Not on file   Number of children: Not on file   Years of education:  Not on file   Highest education level: Not on file  Occupational History   Occupation: pastor    Employer: [employer]  Tobacco Use   Smoking status: Former   Smokeless tobacco: Never  Substance and Sexual Activity   Alcohol use: No   Drug use: No   Sexual activity: Not Currently  Other Topics Concern   Not on file  Social History Narrative   No caffeine drinks    Social Determinants of Health   Financial Resource Strain: Low Risk  (06/12/2022)   Overall Financial Resource Strain (CARDIA)    Difficulty of Paying Living Expenses: Not very hard  Food Insecurity: No Food Insecurity (06/12/2022)   Hunger Vital Sign    Worried About Running Out of Food in the Last Year: Never true    Ran Out of Food in the Last Year: Never true  Transportation Needs: No Transportation Needs (06/12/2022)   PRAPARE - Administrator, Civil Service (Medical): No    Lack of Transportation (Non-Medical): No  Physical Activity: Inactive (06/12/2022)   Exercise Vital Sign    Days of Exercise per Week: 1 day    Minutes of Exercise per Session: 0 min  Stress: No Stress Concern Present (06/12/2022)   [employer] of Occupational Health - Occupational Stress Questionnaire    Feeling of Stress : Not at all  Social Connections: Moderately Integrated (06/12/2022)   Social Connection and  Isolation Panel [NHANES]    Frequency of Communication with Friends and Family: More than three times a week    Frequency of Social Gatherings with Friends and Family: More than three times a week    Attends Religious Services: More than 4 times per year    Active Member of [employer] or Organizations: Yes    Attends Banker Meetings: More than 4 times per year    Marital Status: Separated  Intimate Partner Violence: Not At Risk (06/12/2022)   Humiliation, Afraid, Rape, and Kick questionnaire    Fear of Current or Ex-Partner: No    Emotionally Abused: No    Physically Abused: No    Sexually Abused: No    Review of Systems  All other systems reviewed and are negative.       Objective    BP 127/80 (BP Location: Right Arm, Patient Position: Sitting, Cuff Size: Normal)   Pulse 80   Temp 97.8 F (36.6 C) (Oral)   Resp 16   Ht 5' 3.5" (1.613 m)   Wt 283 lb 3.2 oz (128.5 kg)   SpO2 96%   BMI 49.38 kg/m   Physical Exam Vitals and nursing note reviewed.  Constitutional:      General: She is not in acute distress.    Appearance: She is obese.  Cardiovascular:     Rate and Rhythm: Normal rate and regular rhythm.  Pulmonary:     Effort: Pulmonary effort is normal. No respiratory distress.  Abdominal:     Palpations: Abdomen is soft.     Tenderness: There is no abdominal tenderness.  Musculoskeletal:     Right lower leg: No edema.     Left lower leg: No edema.  Neurological:     General: No focal deficit present.     Mental Status: She is alert and oriented to person, place, and time.         Assessment & Plan:   Type 2 diabetes mellitus with other specified complication, unspecified whether long term insulin use (HCC) -  HM DIABETES FOOT EXAM -     POCT glycosylated hemoglobin (Hb A1C)  Hospital discharge follow-up  Diabetes mellitus treated with oral medication (HCC)  Long-term current use of injectable noninsulin antidiabetic  medication  Screening for colon cancer -     Cologuard  Encounter for screening mammogram for malignant neoplasm of breast -     3D Screening Mammogram, Left and Right; Future  History of TIA (transient ischemic attack)  Encounter for immunization -     Flu Vaccine Trivalent High Dose (Fluad)  Other orders -     Tirzepatide; Inject 10 mg into the skin once a week.  Dispense: 6 mL; Refill: 1     Return in about 3 months (around 04/05/2023) for follow up.   Tommie Raymond, MD

## 2023-01-06 ENCOUNTER — Other Ambulatory Visit: Payer: Self-pay | Admitting: Family Medicine

## 2023-01-11 ENCOUNTER — Encounter: Payer: Self-pay | Admitting: Family Medicine

## 2023-01-12 ENCOUNTER — Telehealth: Payer: Self-pay | Admitting: *Deleted

## 2023-01-12 ENCOUNTER — Other Ambulatory Visit: Payer: Self-pay | Admitting: Family Medicine

## 2023-01-12 NOTE — Telephone Encounter (Signed)
Med refill

## 2023-01-13 NOTE — Telephone Encounter (Signed)
refill 

## 2023-01-23 ENCOUNTER — Other Ambulatory Visit: Payer: Self-pay | Admitting: Family Medicine

## 2023-01-25 NOTE — Telephone Encounter (Signed)
Requested Prescriptions  Pending Prescriptions Disp Refills   furosemide (LASIX) 40 MG tablet [Pharmacy Med Name: Furosemide 40 MG Oral Tablet] 360 tablet 0    Sig: Take 2 tablets by mouth twice daily     Cardiovascular:  Diuretics - Loop Failed - 01/23/2023  6:53 AM      Failed - K in normal range and within 180 days    Potassium  Date Value Ref Range Status  01/29/2022 4.1 3.5 - 5.2 mmol/L Final         Failed - Ca in normal range and within 180 days    Calcium  Date Value Ref Range Status  01/29/2022 9.0 8.7 - 10.3 mg/dL Final         Failed - Na in normal range and within 180 days    Sodium  Date Value Ref Range Status  01/29/2022 141 134 - 144 mmol/L Final         Failed - Cr in normal range and within 180 days    Creatinine, Ser  Date Value Ref Range Status  01/29/2022 1.65 (H) 0.57 - 1.00 mg/dL Final         Failed - Cl in normal range and within 180 days    Chloride  Date Value Ref Range Status  01/29/2022 99 96 - 106 mmol/L Final         Failed - Mg Level in normal range and within 180 days    No results found for: "MG"       Passed - Last BP in normal range    BP Readings from Last 1 Encounters:  01/04/23 127/80         Passed - Valid encounter within last 6 months    Recent Outpatient Visits           3 weeks ago Type 2 diabetes mellitus with other specified complication, unspecified whether long term insulin use (HCC)   [place] [facility], MD   4 months ago Persistent cough   [place] [facility] at [facility], MD   6 months ago SOB (shortness of breath)   [place] [facility], MD   10 months ago Neck pain   [place] [facility] Lauris Poag, MD   12 months ago Type 2 diabetes mellitus with other specified complication, unspecified whether long term insulin use [facility])   [place] [facility] at [facility],  MD       Future Appointments             In 2 months Georganna Skeans, MD [facility] at [facility]

## 2023-01-26 ENCOUNTER — Other Ambulatory Visit: Payer: Self-pay | Admitting: Family Medicine

## 2023-01-26 MED ORDER — APIXABAN 5 MG PO TABS: 5.0000 mg | ORAL_TABLET | Freq: Two times a day (BID) | ORAL | 1 refills | Status: DC

## 2023-01-27 ENCOUNTER — Other Ambulatory Visit: Payer: Self-pay | Admitting: Family Medicine

## 2023-01-27 NOTE — Telephone Encounter (Signed)
Requested Prescriptions  Pending Prescriptions Disp Refills   LANTUS SOLOSTAR 100 UNIT/ML Solostar Pen [Pharmacy Med Name: Lantus SoloStar 100 UNIT/ML Subcutaneous Solution Pen-injector] 45 mL 0    Sig: INJECT 50 UNITS SUBCUTANEOUSLY TWICE DAILY     Endocrinology:  Diabetes - Insulins Passed - 01/27/2023  6:52 AM      Passed - HBA1C is between 0 and 7.9 and within 180 days    Hemoglobin A1C  Date Value Ref Range Status  01/04/2023 7.5 (A) 4.0 - 5.6 % Final         Passed - Valid encounter within last 6 months    Recent Outpatient Visits           3 weeks ago Type 2 diabetes mellitus with other specified complication, unspecified whether long term insulin use (HCC)   [place] [facility] at [facility], MD   4 months ago Persistent cough   [place] [facility] at [facility], MD   6 months ago SOB (shortness of breath)   [place] [facility] at [facility], MD   10 months ago Neck pain   [place] [facility] at [facility], Lauris Poag, MD   12 months ago Type 2 diabetes mellitus with other specified complication, unspecified whether long term insulin use [facility])   [place] [facility] at [facility], MD       Future Appointments             In 2 months Georganna Skeans, MD [facility] at [facility]

## 2023-02-01 ENCOUNTER — Other Ambulatory Visit: Payer: Self-pay | Admitting: Family Medicine

## 2023-02-01 MED ORDER — CARVEDILOL 25 MG PO TABS: 25.0000 mg | ORAL_TABLET | Freq: Two times a day (BID) | ORAL | 1 refills | Status: DC

## 2023-02-01 MED ORDER — OMEPRAZOLE 40 MG PO CPDR: 40.0000 mg | DELAYED_RELEASE_CAPSULE | Freq: Every day | ORAL | 1 refills | Status: DC

## 2023-02-19 ENCOUNTER — Other Ambulatory Visit: Payer: Self-pay | Admitting: Family Medicine

## 2023-02-19 DIAGNOSIS — N644 Mastodynia: Secondary | ICD-10-CM

## 2023-03-02 ENCOUNTER — Other Ambulatory Visit: Payer: Self-pay | Admitting: Family Medicine

## 2023-03-02 MED ORDER — CETIRIZINE HCL 10 MG PO TABS: 10.0000 mg | ORAL_TABLET | Freq: Two times a day (BID) | ORAL | 1 refills | Status: DC

## 2023-03-03 ENCOUNTER — Ambulatory Visit
Admission: RE | Admit: 2023-03-03 | Discharge: 2023-03-03 | Disposition: A | Discharge status: Home / Self Care | Payer: PPO | Source: Ambulatory Visit | Attending: Family Medicine | Admitting: Family Medicine

## 2023-03-03 DIAGNOSIS — N644 Mastodynia: Secondary | ICD-10-CM

## 2023-03-05 ENCOUNTER — Other Ambulatory Visit: Payer: Self-pay | Admitting: Family Medicine

## 2023-03-08 ENCOUNTER — Other Ambulatory Visit: Payer: Self-pay | Admitting: Family Medicine

## 2023-03-08 MED ORDER — LANTUS SOLOSTAR 100 UNIT/ML ~~LOC~~ SOPN: PEN_INJECTOR | SUBCUTANEOUS | 5 refills | Status: DC

## 2023-03-08 MED ORDER — ATORVASTATIN CALCIUM 20 MG PO TABS: 20.0000 mg | ORAL_TABLET | Freq: Every day | ORAL | 1 refills | Status: DC

## 2023-03-08 MED ORDER — INSULIN ASPART 100 UNIT/ML FLEXPEN: PEN_INJECTOR | SUBCUTANEOUS | 5 refills | Status: DC

## 2023-03-22 ENCOUNTER — Other Ambulatory Visit: Payer: Self-pay | Admitting: Family Medicine

## 2023-03-22 MED ORDER — ATORVASTATIN CALCIUM 20 MG PO TABS: 20.0000 mg | ORAL_TABLET | Freq: Every day | ORAL | 1 refills | Status: DC

## 2023-03-24 ENCOUNTER — Other Ambulatory Visit: Payer: Self-pay | Admitting: Family Medicine

## 2023-04-06 ENCOUNTER — Ambulatory Visit: Payer: PPO | Admitting: Family Medicine

## 2023-04-22 ENCOUNTER — Other Ambulatory Visit: Payer: Self-pay | Admitting: Family Medicine

## 2023-04-30 ENCOUNTER — Other Ambulatory Visit: Payer: Self-pay | Admitting: Family Medicine

## 2023-04-30 MED ORDER — METHYLPREDNISOLONE 4 MG PO TBPK: ORAL_TABLET | ORAL | 0 refills | Status: DC

## 2023-05-17 ENCOUNTER — Other Ambulatory Visit: Payer: Self-pay | Admitting: Family Medicine

## 2023-05-21 ENCOUNTER — Other Ambulatory Visit: Payer: Self-pay | Admitting: Family Medicine

## 2023-05-21 NOTE — Telephone Encounter (Signed)
Requested medication (s) are due for refill today: Yes  Requested medication (s) are on the active medication list: No  Last refill:  01/04/23  Future visit scheduled: No  Notes to clinic:  Unable to refill per protocol, last refill by another provider.      Requested Prescriptions  Pending Prescriptions Disp Refills   MOUNJARO 7.5 MG/0.5ML Pen [Pharmacy Med Name: Mounjaro 7.5 MG/0.5ML Subcutaneous Solution Pen-injector] 12 mL 0    Sig: INJECT 7.5 MG INTO THE SKIN ONCE A WEEK     Off-Protocol Failed - 05/21/2023 11:47 AM      Failed - Medication not assigned to a protocol, review manually.      Passed - Valid encounter within last 12 months    Recent Outpatient Visits           4 months ago Type 2 diabetes mellitus with other specified complication, unspecified whether long term insulin use (HCC)   [place] [facility], MD   8 months ago Persistent cough   [place] [facility], MD   10 months ago SOB (shortness of breath)   [place] [facility], MD   1 year ago Neck pain   [place] [facility], Lauris Poag, MD   1 year ago Type 2 diabetes mellitus with other specified complication, unspecified whether long term insulin use [facility])   [place] [facility], MD

## 2023-05-31 ENCOUNTER — Other Ambulatory Visit: Payer: Self-pay | Admitting: Family Medicine

## 2023-05-31 MED ORDER — FREESTYLE LIBRE 3 PLUS SENSOR MISC: 1 refills | Status: DC

## 2023-05-31 MED ORDER — GABAPENTIN 600 MG PO TABS: 600.0000 mg | ORAL_TABLET | Freq: Two times a day (BID) | ORAL | 1 refills | Status: DC

## 2023-06-07 ENCOUNTER — Other Ambulatory Visit: Payer: Self-pay | Admitting: Family Medicine

## 2023-06-15 ENCOUNTER — Other Ambulatory Visit: Payer: Self-pay | Admitting: Family Medicine

## 2023-06-27 ENCOUNTER — Other Ambulatory Visit: Payer: Self-pay | Admitting: Family Medicine

## 2023-07-06 ENCOUNTER — Encounter: Payer: Self-pay | Admitting: Family Medicine

## 2023-07-06 ENCOUNTER — Ambulatory Visit (INDEPENDENT_AMBULATORY_CARE_PROVIDER_SITE_OTHER): Admitting: Family Medicine

## 2023-07-06 VITALS — BP 98/66 | HR 97 | Temp 98.4°F | Resp 18 | Ht 63.5 in | Wt 274.0 lb

## 2023-07-06 DIAGNOSIS — M2550 Pain in unspecified joint: Secondary | ICD-10-CM

## 2023-07-06 DIAGNOSIS — R053 Chronic cough: Secondary | ICD-10-CM

## 2023-07-06 DIAGNOSIS — E1169 Type 2 diabetes mellitus with other specified complication: Secondary | ICD-10-CM | POA: Diagnosis not present

## 2023-07-06 DIAGNOSIS — Z794 Long term (current) use of insulin: Secondary | ICD-10-CM

## 2023-07-06 DIAGNOSIS — Z7985 Long-term (current) use of injectable non-insulin antidiabetic drugs: Secondary | ICD-10-CM

## 2023-07-06 LAB — POCT GLYCOSYLATED HEMOGLOBIN (HGB A1C): Hemoglobin A1C: 7.8 % — AB (ref 4.0–5.6)

## 2023-07-06 MED ORDER — HYDROCODONE BIT-HOMATROP MBR 5-1.5 MG/5ML PO SOLN: 5.0000 mL | Freq: Four times a day (QID) | ORAL | 0 refills | Status: DC | PRN

## 2023-07-06 MED ORDER — METHYLPREDNISOLONE 4 MG PO TBPK: ORAL_TABLET | ORAL | 0 refills | Status: DC

## 2023-07-06 NOTE — Progress Notes (Unsigned)
 Established Patient Office Visit  Subjective    Patient ID: Tonya Evans, female    DOB: 1956-04-06  Age: 68 y.o. MRN: [id number]  CC:  Chief Complaint  Patient presents with   A1C Check    gout    HPI Tonya Evans presents for routine follow up of chronic med issues, specifically diabetes. Patient also reports that she has severe pain in her feet  and a persistent harsh dry cough. She denies fever/chills or viral sx. She has had the cough for about 3-4 weeks.  Outpatient Encounter Medications as of 07/06/2023  Medication Sig   acetaminophen (TYLENOL) 500 MG tablet Take by mouth.   albuterol (PROVENTIL) (2.5 MG/3ML) 0.083% nebulizer solution Take 2.5 mg by nebulization every 4 (four) hours as needed.   albuterol (VENTOLIN HFA) 108 (90 Base) MCG/ACT inhaler INHALE 2 PUFFS BY MOUTH EVERY 6 HOURS AS NEEDED FOR COUGHING, WHEEZING, OR SHORTNESS OF BREATH   apixaban (ELIQUIS) 5 MG TABS tablet Take 1 tablet (5 mg total) by mouth 2 (two) times daily.   aspirin 81 MG chewable tablet Chew by mouth.   atorvastatin (LIPITOR) 20 MG tablet Take 1 tablet (20 mg total) by mouth daily.   benzonatate (TESSALON) 100 MG capsule Take 1 capsule (100 mg total) by mouth 2 (two) times daily as needed for cough.   budesonide (PULMICORT) 0.5 MG/2ML nebulizer solution USE 1 VIAL IN NEBULIZER ONCE DAILY   carvedilol (COREG) 25 MG tablet Take 1 tablet (25 mg total) by mouth 2 (two) times daily with a meal.   cetirizine (ZYRTEC) 10 MG tablet Take 1 tablet (10 mg total) by mouth 2 (two) times daily.   Cholecalciferol 25 MCG (1000 UT) tablet Take by mouth.   clonazePAM (KLONOPIN) 0.5 MG tablet Take 1 tablet (0.5 mg total) by mouth as needed.   Continuous Glucose Sensor (FREESTYLE LIBRE 2 SENSOR) MISC PLACE AS DIRECTED EVERY 14 DAYS TO READ BLOOD GLUCOSE   cyclobenzaprine (FLEXERIL) 5 MG tablet Take 1 tablet (5 mg total) by mouth 3 (three) times daily as needed for muscle spasms.   digoxin (LANOXIN) 0.125 MG tablet  Take 1 tablet by mouth once daily   ezetimibe (ZETIA) 10 MG tablet TAKE 1 Tablet BY MOUTH ONCE EVERY DAY   furosemide (LASIX) 40 MG tablet Take 2 tablets by mouth twice daily   gabapentin (NEURONTIN) 600 MG tablet Take 1 tablet (600 mg total) by mouth 2 (two) times daily.   hydrALAZINE (APRESOLINE) 25 MG tablet Take 1 tablet by mouth twice daily   HYDROcodone bit-homatropine (HYCODAN) 5-1.5 MG/5ML syrup Take 5 mLs by mouth every 6 (six) hours as needed for cough.   insulin aspart (NOVOLOG) 100 UNIT/ML FlexPen Inject 15 units SQ 3 times daily before meals.   iron polysaccharides (NIFEREX) 150 MG capsule Take by mouth.   isosorbide mononitrate (IMDUR) 30 MG 24 hr tablet Take 1 tablet by mouth once daily   LANTUS SOLOSTAR 100 UNIT/ML Solostar Pen INJECT 50 UNITS SUBCUTANEOUSLY TWICE DAILY   losartan (COZAAR) 50 MG tablet Take 1 tablet by mouth daily.   methocarbamol (ROBAXIN) 500 MG tablet as needed.   MOUNJARO 10 MG/0.5ML Pen INJECT 10 MG INTO THE SKIN  ONCE A WEEK   mupirocin ointment (BACTROBAN) 2 % Apply 1 Application topically 2 (two) times daily.   omeprazole (PRILOSEC) 40 MG capsule Take 1 capsule by mouth once daily   ondansetron (ZOFRAN) 4 MG tablet Take 1 tablet (4 mg total) by mouth every 8 (  eight) hours as needed for nausea or vomiting.   spironolactone (ALDACTONE) 25 MG tablet Take 1 tablet (25 mg total) by mouth daily.   triamcinolone 0.1% oint-Eucerin equivalent cream 1:1 mixture Apply topically 2 (two) times daily as needed.   [DISCONTINUED] methylPREDNISolone (MEDROL DOSEPAK) 4 MG TBPK tablet Take daily as directed   [DISCONTINUED] methylPREDNISolone (MEDROL DOSEPAK) 4 MG TBPK tablet Take po daily as directed   [DISCONTINUED] methylPREDNISolone (MEDROL) 2 MG tablet Take 1 tablet (2 mg total) by mouth daily.   methylPREDNISolone (MEDROL DOSEPAK) 4 MG TBPK tablet Take po daily as directed   Facility-Administered Encounter Medications as of 07/06/2023  Medication   0.9 %  sodium  chloride infusion   promethazine (PHENERGAN) injection 25 mg    Past Medical History:  Diagnosis Date   A-fib (HCC)    CHF (congestive heart failure) (HCC)    Constipation    History of blood clots 04/20/2005   Hyperlipemia    Hypertension    Obesity    Type II or unspecified type diabetes mellitus without mention of complication, not stated as uncontrolled     Past Surgical History:  Procedure Laterality Date   APPENDECTOMY  1979   CARDIAC DEFIBRILLATOR PLACEMENT  2008   CESAREAN SECTION     x 3    HERNIA REPAIR  2005   TONSILLECTOMY      Family History  Problem Relation Age of Onset   Diabetes Father    Hypertension Mother    Diabetes Brother    Hypertension Sister    Breast cancer Mother    Colon cancer Neg Hx     Social History   Socioeconomic History   Marital status: Legally Separated    Spouse name: Not on file   Number of children: Not on file   Years of education: Not on file   Highest education level: Not on file  Occupational History   Occupation: pastor    Employer: [employer]  Tobacco Use   Smoking status: Former   Smokeless tobacco: Never  Substance and Sexual Activity   Alcohol use: No   Drug use: No   Sexual activity: Not Currently  Other Topics Concern   Not on file  Social History Narrative   No caffeine drinks    Social Drivers of Corporate investment banker Strain: Low Risk  (06/12/2022)   Overall Financial Resource Strain (CARDIA)    Difficulty of Paying Living Expenses: Not very hard  Food Insecurity: Low Risk  (05/31/2023)   Received from [facility]   Hunger Vital Sign    Worried About Running Out of Food in the Last Year: Never true    Ran Out of Food in the Last Year: Never true  Transportation Needs: No Transportation Needs (05/31/2023)   Received from [facility]    In the past 12 months, has lack of reliable transportation kept you from medical appointments, meetings, work or from getting  things needed for daily living? : No  Physical Activity: Inactive (06/12/2022)   Exercise Vital Sign    Days of Exercise per Week: 1 day    Minutes of Exercise per Session: 0 min  Stress: No Stress Concern Present (06/12/2022)   [employer] of Occupational Health - Occupational Stress Questionnaire    Feeling of Stress : Not at all  Social Connections: Moderately Integrated (06/12/2022)   Social Connection and Isolation Panel [NHANES]    Frequency of Communication with Friends and Family:  More than three times a week    Frequency of Social Gatherings with Friends and Family: More than three times a week    Attends Religious Services: More than 4 times per year    Active Member of [employer] or Organizations: Yes    Attends Banker Meetings: More than 4 times per year    Marital Status: Separated  Intimate Partner Violence: Not At Risk (06/12/2022)   Humiliation, Afraid, Rape, and Kick questionnaire    Fear of Current or Ex-Partner: No    Emotionally Abused: No    Physically Abused: No    Sexually Abused: No    Review of Systems  Respiratory:  Positive for cough and shortness of breath. Negative for sputum production.   Cardiovascular:  Negative for chest pain.  All other systems reviewed and are negative.       Objective    BP 98/66   Pulse 97   Temp 98.4 F (36.9 C) (Oral)   Resp 18   Ht 5' 3.5" (1.613 m)   Wt 274 lb (124.3 kg)   SpO2 96%   BMI 47.78 kg/m   Physical Exam Vitals and nursing note reviewed.  Constitutional:      General: She is not in acute distress.    Appearance: She is obese.  Cardiovascular:     Rate and Rhythm: Normal rate and regular rhythm.  Pulmonary:     Effort: Pulmonary effort is normal. No respiratory distress.  Abdominal:     Palpations: Abdomen is soft.     Tenderness: There is no abdominal tenderness.  Musculoskeletal:     Right lower leg: No edema.     Left lower leg: No edema.  Neurological:     General: No focal  deficit present.     Mental Status: She is alert and oriented to person, place, and time.         Assessment & Plan:   Type 2 diabetes mellitus with other specified complication, unspecified whether long term insulin use (HCC) -     POCT glycosylated hemoglobin (Hb A1C) -     Basic metabolic panel  Arthralgia, unspecified joint -     ANA -     Sedimentation rate -     Rheumatoid factor  Persistent cough -     Basic metabolic panel -     CBC with Differential/Platelet  Encounter for long-term (current) use of insulin (HCC)  Long-term current use of injectable noninsulin antidiabetic medication  Other orders -     methylPREDNISolone; Take po daily as directed  Dispense: 21 tablet; Refill: 0 -     HYDROcodone Bit-Homatrop MBr; Take 5 mLs by mouth every 6 (six) hours as needed for cough.  Dispense: 240 mL; Refill: 0     Return in about 3 months (around 10/06/2023) for follow up, chronic med issues.   Tommie Raymond, MD [place]

## 2023-07-07 ENCOUNTER — Encounter: Payer: Self-pay | Admitting: Family Medicine

## 2023-07-07 LAB — SEDIMENTATION RATE: Sed Rate: 52 mm/h — ABNORMAL HIGH (ref 0–40)

## 2023-07-07 LAB — ANA: Anti Nuclear Antibody (ANA): NEGATIVE

## 2023-07-07 LAB — RHEUMATOID FACTOR: Rheumatoid fact SerPl-aCnc: 24.4 [IU]/mL — ABNORMAL HIGH (ref <14.0)

## 2023-07-08 LAB — CBC WITH DIFFERENTIAL/PLATELET
Basophils Absolute: 0 10*3/uL (ref 0.0–0.2)
Basos: 1 %
EOS (ABSOLUTE): 0.2 10*3/uL (ref 0.0–0.4)
Eos: 3 %
Hematocrit: 33.7 % — ABNORMAL LOW (ref 34.0–46.6)
Hemoglobin: 10.6 g/dL — ABNORMAL LOW (ref 11.1–15.9)
Immature Grans (Abs): 0 10*3/uL (ref 0.0–0.1)
Immature Granulocytes: 1 %
Lymphocytes Absolute: 1.2 10*3/uL (ref 0.7–3.1)
Lymphs: 22 %
MCH: 28.6 pg (ref 26.6–33.0)
MCHC: 31.5 g/dL (ref 31.5–35.7)
MCV: 91 fL (ref 79–97)
Monocytes Absolute: 0.6 10*3/uL (ref 0.1–0.9)
Monocytes: 10 %
Neutrophils Absolute: 3.5 10*3/uL (ref 1.4–7.0)
Neutrophils: 63 %
Platelets: 289 10*3/uL (ref 150–450)
RBC: 3.7 x10E6/uL — ABNORMAL LOW (ref 3.77–5.28)
RDW: 13.3 % (ref 11.7–15.4)
WBC: 5.5 10*3/uL (ref 3.4–10.8)

## 2023-07-08 LAB — BASIC METABOLIC PANEL
BUN/Creatinine Ratio: 21 (ref 12–28)
BUN: 50 mg/dL — ABNORMAL HIGH (ref 8–27)
CO2: 22 mmol/L (ref 20–29)
Calcium: 9.2 mg/dL (ref 8.7–10.3)
Chloride: 98 mmol/L (ref 96–106)
Creatinine, Ser: 2.37 mg/dL — ABNORMAL HIGH (ref 0.57–1.00)
Glucose: 100 mg/dL — ABNORMAL HIGH (ref 70–99)
Potassium: 4.7 mmol/L (ref 3.5–5.2)
Sodium: 139 mmol/L (ref 134–144)
eGFR: 22 mL/min/{1.73_m2} — ABNORMAL LOW (ref >59)

## 2023-07-08 LAB — SPECIMEN STATUS REPORT

## 2023-07-24 ENCOUNTER — Other Ambulatory Visit: Payer: Self-pay | Admitting: Family Medicine

## 2023-07-28 ENCOUNTER — Telehealth: Payer: Self-pay

## 2023-07-28 NOTE — Transitions of Care (Post Inpatient/ED Visit) (Signed)
   07/28/2023  Name: MILLA WAHLBERG MRN: [id number] DOB: 07/14/1955  Today's TOC FU Call Status: Today's TOC FU Call Status:: Unsuccessful Call (1st Attempt) Unsuccessful Call (1st Attempt) Date: 07/28/23  Attempted to reach the patient regarding the most recent Inpatient/ED visit.  Follow Up Plan: Additional outreach attempts will be made to reach the patient to complete the Transitions of Care (Post Inpatient/ED visit) call.   Bary Leriche RN, MSN [facility], [facility] RN Care Manager Direct Dial: [phone]  Fax: [phone] Website: [email or web address]

## 2023-07-29 ENCOUNTER — Telehealth: Payer: Self-pay

## 2023-07-29 ENCOUNTER — Other Ambulatory Visit: Payer: Self-pay | Admitting: Family Medicine

## 2023-07-29 ENCOUNTER — Ambulatory Visit: Admitting: Family Medicine

## 2023-07-29 ENCOUNTER — Encounter: Payer: Self-pay | Admitting: Family Medicine

## 2023-07-29 VITALS — BP 115/71 | HR 89

## 2023-07-29 DIAGNOSIS — N179 Acute kidney failure, unspecified: Secondary | ICD-10-CM

## 2023-07-29 DIAGNOSIS — I509 Heart failure, unspecified: Secondary | ICD-10-CM

## 2023-07-29 DIAGNOSIS — Z09 Encounter for follow-up examination after completed treatment for conditions other than malignant neoplasm: Secondary | ICD-10-CM

## 2023-07-29 DIAGNOSIS — I5023 Acute on chronic systolic (congestive) heart failure: Secondary | ICD-10-CM | POA: Diagnosis not present

## 2023-07-29 DIAGNOSIS — R600 Localized edema: Secondary | ICD-10-CM | POA: Diagnosis not present

## 2023-07-29 DIAGNOSIS — E1169 Type 2 diabetes mellitus with other specified complication: Secondary | ICD-10-CM

## 2023-07-29 DIAGNOSIS — I10 Essential (primary) hypertension: Secondary | ICD-10-CM | POA: Diagnosis not present

## 2023-07-29 NOTE — Transitions of Care (Post Inpatient/ED Visit) (Signed)
   07/29/2023  Name: Tonya Evans MRN: [id number] DOB: [birth year]  Today's TOC FU Call Status:    Attempted to reach the patient regarding the most recent Inpatient/ED visit.  Follow Up Plan: Additional outreach attempts will be made to reach the patient to complete the Transitions of Care (Post Inpatient/ED visit) call.   Bary Leriche RN, MSN [facility], [facility] RN Care Manager Direct Dial: [phone]  Fax: [phone] Website: [email or web address]

## 2023-07-30 ENCOUNTER — Telehealth: Payer: Self-pay

## 2023-07-30 LAB — CBC WITH DIFFERENTIAL/PLATELET
Basophils Absolute: 0 10*3/uL (ref 0.0–0.2)
Basos: 1 %
EOS (ABSOLUTE): 0.1 10*3/uL (ref 0.0–0.4)
Eos: 4 %
Hematocrit: 28.9 % — ABNORMAL LOW (ref 34.0–46.6)
Hemoglobin: 9 g/dL — ABNORMAL LOW (ref 11.1–15.9)
Immature Grans (Abs): 0.1 10*3/uL (ref 0.0–0.1)
Immature Granulocytes: 2 %
Lymphocytes Absolute: 0.9 10*3/uL (ref 0.7–3.1)
Lymphs: 29 %
MCH: 28 pg (ref 26.6–33.0)
MCHC: 31.1 g/dL — ABNORMAL LOW (ref 31.5–35.7)
MCV: 90 fL (ref 79–97)
Monocytes Absolute: 0.5 10*3/uL (ref 0.1–0.9)
Monocytes: 14 %
Neutrophils Absolute: 1.7 10*3/uL (ref 1.4–7.0)
Neutrophils: 50 %
Platelets: 283 10*3/uL (ref 150–450)
RBC: 3.21 x10E6/uL — ABNORMAL LOW (ref 3.77–5.28)
RDW: 14.2 % (ref 11.7–15.4)
WBC: 3.2 10*3/uL — ABNORMAL LOW (ref 3.4–10.8)

## 2023-07-30 LAB — BASIC METABOLIC PANEL WITH GFR
BUN/Creatinine Ratio: 27 (ref 12–28)
BUN: 58 mg/dL — ABNORMAL HIGH (ref 8–27)
CO2: 19 mmol/L — ABNORMAL LOW (ref 20–29)
Calcium: 9.1 mg/dL (ref 8.7–10.3)
Chloride: 107 mmol/L — ABNORMAL HIGH (ref 96–106)
Creatinine, Ser: 2.18 mg/dL — ABNORMAL HIGH (ref 0.57–1.00)
Glucose: 118 mg/dL — ABNORMAL HIGH (ref 70–99)
Potassium: 5.1 mmol/L (ref 3.5–5.2)
Sodium: 143 mmol/L (ref 134–144)
eGFR: 24 mL/min/{1.73_m2} — ABNORMAL LOW (ref >59)

## 2023-07-30 NOTE — Transitions of Care (Post Inpatient/ED Visit) (Signed)
   07/30/2023  Name: Tonya Evans MRN: [id number] DOB: [birth year]  Today's TOC FU Call Status: Today's TOC FU Call Status:: Successful TOC FU Call Completed Unsuccessful Call (1st Attempt) Date: 07/28/23 Unsuccessful Call (2nd Attempt) Date: 07/29/23 [facility] FU  Call partial Completion Date: 07/30/23  Attempted to reach the patient regarding the most recent Inpatient/ED visit.Call was placed to patient spoke with her and daughter who stated the [facility] Therapist was still there completing an assessment and requested a call back and a later time   Follow Up Plan: Additional outreach attempts will be made to reach the patient to complete the Transitions of Care (Post Inpatient/ED visit) call. This will be the 4th attempt   Susa Loffler , BSN, RN [facility]   VBCI-Population Health RN Care Manager Direct Dial [phone]  Fax: [phone] Website: [email or web address]

## 2023-08-02 NOTE — Telephone Encounter (Signed)
 I have attempted without success to contact this [employer] by phone to return their call and I left a message on answering machine.

## 2023-08-03 ENCOUNTER — Encounter: Payer: Self-pay | Admitting: Family Medicine

## 2023-08-03 NOTE — Progress Notes (Signed)
 Established Patient Office Visit  Subjective    Patient ID: Tonya Evans, female    DOB: 1955-12-22  Age: 68 y.o. MRN: [id number]  CC:  Chief Complaint  Patient presents with   Hospitalization Follow-up    SOB and swelling    Medication Management    HPI Tonya Evans presents for routine hospital discharge follow up. Patient was admitted with AKI and CHF and edema.The AKI most likely was a result of ED giving contrast dye. Patient reports some improvements since discharge.   Outpatient Encounter Medications as of 07/29/2023  Medication Sig   acetaminophen (TYLENOL) 500 MG tablet Take by mouth.   albuterol (PROVENTIL) (2.5 MG/3ML) 0.083% nebulizer solution Take 2.5 mg by nebulization every 4 (four) hours as needed.   albuterol (VENTOLIN HFA) 108 (90 Base) MCG/ACT inhaler INHALE 2 PUFFS BY MOUTH EVERY 6 HOURS AS NEEDED FOR COUGHING, WHEEZING, OR SHORTNESS OF BREATH   aspirin 81 MG chewable tablet Chew by mouth.   atorvastatin (LIPITOR) 20 MG tablet Take 1 tablet (20 mg total) by mouth daily.   benzonatate (TESSALON) 100 MG capsule Take 1 capsule (100 mg total) by mouth 2 (two) times daily as needed for cough.   budesonide (PULMICORT) 0.5 MG/2ML nebulizer solution USE 1 VIAL IN NEBULIZER ONCE DAILY   cetirizine (ZYRTEC) 10 MG tablet Take 1 tablet (10 mg total) by mouth 2 (two) times daily.   Cholecalciferol 25 MCG (1000 UT) tablet Take by mouth.   clonazePAM (KLONOPIN) 0.5 MG tablet Take 1 tablet (0.5 mg total) by mouth as needed.   Continuous Glucose Sensor (FREESTYLE LIBRE 2 SENSOR) MISC PLACE AS DIRECTED EVERY 14 DAYS TO READ BLOOD GLUCOSE   cyclobenzaprine (FLEXERIL) 5 MG tablet Take 1 tablet (5 mg total) by mouth 3 (three) times daily as needed for muscle spasms.   digoxin (LANOXIN) 0.125 MG tablet Take 1 tablet by mouth once daily   ezetimibe (ZETIA) 10 MG tablet TAKE 1 Tablet BY MOUTH ONCE EVERY DAY   furosemide (LASIX) 40 MG tablet Take 2 tablets by mouth twice daily    gabapentin (NEURONTIN) 600 MG tablet Take 1 tablet (600 mg total) by mouth 2 (two) times daily.   hydrALAZINE (APRESOLINE) 25 MG tablet Take 1 tablet by mouth twice daily   HYDROcodone bit-homatropine (HYCODAN) 5-1.5 MG/5ML syrup Take 5 mLs by mouth every 6 (six) hours as needed for cough.   insulin aspart (NOVOLOG) 100 UNIT/ML FlexPen Inject 15 units SQ 3 times daily before meals.   iron polysaccharides (NIFEREX) 150 MG capsule Take by mouth.   isosorbide mononitrate (IMDUR) 30 MG 24 hr tablet Take 1 tablet by mouth once daily   LANTUS SOLOSTAR 100 UNIT/ML Solostar Pen INJECT 50 UNITS SUBCUTANEOUSLY TWICE DAILY   losartan (COZAAR) 50 MG tablet Take 1 tablet by mouth daily.   methocarbamol (ROBAXIN) 500 MG tablet as needed.   methylPREDNISolone (MEDROL DOSEPAK) 4 MG TBPK tablet Take po daily as directed   MOUNJARO 10 MG/0.5ML Pen INJECT 10 MG INTO THE SKIN  ONCE A WEEK   mupirocin ointment (BACTROBAN) 2 % Apply 1 Application topically 2 (two) times daily.   omeprazole (PRILOSEC) 40 MG capsule Take 1 capsule by mouth once daily   ondansetron (ZOFRAN) 4 MG tablet Take 1 tablet (4 mg total) by mouth every 8 (eight) hours as needed for nausea or vomiting.   spironolactone (ALDACTONE) 25 MG tablet Take 1 tablet (25 mg total) by mouth daily.   triamcinolone 0.1% oint-Eucerin equivalent cream  1:1 mixture Apply topically 2 (two) times daily as needed.   [DISCONTINUED] apixaban (ELIQUIS) 5 MG TABS tablet Take 1 tablet (5 mg total) by mouth 2 (two) times daily.   [DISCONTINUED] carvedilol (COREG) 25 MG tablet Take 1 tablet (25 mg total) by mouth 2 (two) times daily with a meal.   Facility-Administered Encounter Medications as of 07/29/2023  Medication   0.9 %  sodium chloride infusion   promethazine (PHENERGAN) injection 25 mg    Past Medical History:  Diagnosis Date   A-fib (HCC)    CHF (congestive heart failure) (HCC)    Constipation    History of blood clots 04/20/2005   Hyperlipemia     Hypertension    Obesity    Type II or unspecified type diabetes mellitus without mention of complication, not stated as uncontrolled     Past Surgical History:  Procedure Laterality Date   APPENDECTOMY  1979   CARDIAC DEFIBRILLATOR PLACEMENT  2008   CESAREAN SECTION     x 3    HERNIA REPAIR  2005   TONSILLECTOMY      Family History  Problem Relation Age of Onset   Diabetes Father    Hypertension Mother    Diabetes Brother    Hypertension Sister    Breast cancer Mother    Colon cancer Neg Hx     Social History   Socioeconomic History   Marital status: Legally Separated    Spouse name: Not on file   Number of children: Not on file   Years of education: Not on file   Highest education level: Not on file  Occupational History   Occupation: pastor    Employer: [employer]  Tobacco Use   Smoking status: Former   Smokeless tobacco: Never  Substance and Sexual Activity   Alcohol use: No   Drug use: No   Sexual activity: Not Currently  Other Topics Concern   Not on file  Social History Narrative   No caffeine drinks    Social Drivers of Corporate investment banker Strain: Low Risk  (06/12/2022)   Overall Financial Resource Strain (CARDIA)    Difficulty of Paying Living Expenses: Not very hard  Food Insecurity: No Food Insecurity (07/30/2023)   Hunger Vital Sign    Worried About Running Out of Food in the Last Year: Never true    Ran Out of Food in the Last Year: Never true  Transportation Needs: No Transportation Needs (07/30/2023)   PRAPARE - Administrator, Civil Service (Medical): No    Lack of Transportation (Non-Medical): No  Physical Activity: Inactive (06/12/2022)   Exercise Vital Sign    Days of Exercise per Week: 1 day    Minutes of Exercise per Session: 0 min  Stress: No Stress Concern Present (06/12/2022)   [employer] of Occupational Health - Occupational Stress Questionnaire    Feeling of Stress : Not at all  Social  Connections: Moderately Integrated (06/12/2022)   Social Connection and Isolation Panel [NHANES]    Frequency of Communication with Friends and Family: More than three times a week    Frequency of Social Gatherings with Friends and Family: More than three times a week    Attends Religious Services: More than 4 times per year    Active Member of [employer] or Organizations: Yes    Attends Banker Meetings: More than 4 times per year    Marital Status: Separated  Intimate Partner Violence: Not At  Risk (07/30/2023)   Humiliation, Afraid, Rape, and Kick questionnaire    Fear of Current or Ex-Partner: No    Emotionally Abused: No    Physically Abused: No    Sexually Abused: No    Review of Systems  All other systems reviewed and are negative.       Objective    BP 115/71   Pulse 89   SpO2 96%   Physical Exam Vitals and nursing note reviewed.  Constitutional:      General: She is not in acute distress.    Appearance: She is obese.  Cardiovascular:     Rate and Rhythm: Normal rate and regular rhythm.  Pulmonary:     Effort: Pulmonary effort is normal.     Breath sounds: Normal breath sounds.  Abdominal:     Palpations: Abdomen is soft.     Tenderness: There is no abdominal tenderness.  Musculoskeletal:     Right lower leg: Edema present.     Left lower leg: Edema present.     Comments: Utilizing w/c for stability  Neurological:     General: No focal deficit present.     Mental Status: She is alert and oriented to person, place, and time.  Psychiatric:        Mood and Affect: Affect is blunt.        Behavior: Behavior is slowed.        Thought Content: Thought content normal.         Assessment & Plan:   Hospital discharge follow-up -     Basic metabolic panel with GFR -     CBC with Differential/Platelet  AKI (acute kidney injury) (HCC)  Edema of both lower legs  Acute on chronic heart failure, unspecified heart failure type (HCC)  Type 2 diabetes  mellitus with other specified complication, unspecified whether long term insulin use (HCC)  Essential hypertension    Return in about 4 weeks (around 08/26/2023) for follow up.   Arlo Lama, MD

## 2023-08-09 ENCOUNTER — Telehealth: Payer: Self-pay | Admitting: Emergency Medicine

## 2023-08-09 ENCOUNTER — Telehealth: Payer: Self-pay | Admitting: Family Medicine

## 2023-08-09 NOTE — Telephone Encounter (Signed)
 I made her aware MD Elvan Hamel stated 10 units before meals.

## 2023-08-09 NOTE — Telephone Encounter (Signed)
 A document form has been faxed: Home Health Certificate (Order ID [id number]), to be filled out by provider. Send document back via Fax within 5-days. Document is located in providers tray at front office.           Fax number:  [phone]

## 2023-08-10 NOTE — Telephone Encounter (Signed)
 Will place on providers desk when in office tomorrow

## 2023-08-16 ENCOUNTER — Telehealth: Payer: Self-pay | Admitting: Family Medicine

## 2023-08-16 ENCOUNTER — Telehealth: Payer: Self-pay | Admitting: *Deleted

## 2023-08-16 NOTE — Telephone Encounter (Signed)
 Noted.

## 2023-08-16 NOTE — Telephone Encounter (Signed)
 A document form from [facility] has been faxed: Home Health Certificate (Order ID [id number]), to be filled out by provider. Send document back via Fax within 7-days. Document is located in providers tray at front office.           Fax number:  [phone]

## 2023-08-17 ENCOUNTER — Telehealth: Payer: Self-pay | Admitting: *Deleted

## 2023-08-17 NOTE — Transitions of Care (Post Inpatient/ED Visit) (Signed)
   08/17/2023  Name: CHASSITY GARELICK MRN: [id number] DOB: 1955/04/24  Today's TOC FU Call Status: Today's TOC FU Call Status:: Unsuccessful Call (2nd Attempt) Unsuccessful Call (2nd Attempt) Date: 08/17/23  Attempted to reach the patient regarding the most recent Inpatient/ED visit.  Follow Up Plan: Additional outreach attempts will be made to reach the patient to complete the Transitions of Care (Post Inpatient/ED visit) call.   Una Ganser BSN RN [facility] Care Management Coordinator [email or web address] Direct Dial: [phone]  Fax: [phone] Website: [email or web address]

## 2023-08-18 ENCOUNTER — Telehealth: Payer: Self-pay

## 2023-08-18 NOTE — Transitions of Care (Post Inpatient/ED Visit) (Signed)
   08/18/2023  Name: Tonya Evans MRN: [id number] DOB: 01/30/1956  Today's TOC FU Call Status: Today's TOC FU Call Status:: Unsuccessful Call (3rd Attempt) Unsuccessful Call (3rd Attempt) Date: 08/18/23  Attempted to reach the patient regarding the most recent Inpatient/ED visit.  Follow Up Plan: No further outreach attempts will be made at this time. We have been unable to contact the patient.   Katheryn Pandy MSN, RN RN Case Sales executive Health  VBCI-Population Health Office Hours Wed/Thur  8:00 am-6:00 pm Direct Dial: [phone] Main Phone [phone]  Fax: [phone] [email or web address]

## 2023-08-20 ENCOUNTER — Telehealth: Payer: Self-pay | Admitting: Family Medicine

## 2023-08-20 ENCOUNTER — Telehealth: Payer: Self-pay

## 2023-08-20 NOTE — Telephone Encounter (Signed)
 noted

## 2023-08-20 NOTE — Telephone Encounter (Signed)
 Copied from CRM [id number]. Topic: Clinical - Home Health Verbal Orders >> Aug 20, 2023  9:47 AM [employer] Arts wrote: Caller/Agency: [facility] Callback Number: [phone] Service Requested: Skilled Nursing Frequency: 5 weeks Any new concerns about the patient? No

## 2023-08-20 NOTE — Telephone Encounter (Signed)
 Called and left voice mail

## 2023-08-20 NOTE — Telephone Encounter (Signed)
 A document form from [facility] has been faxed: Home Health Certificate (Order [place] [id number]), to be filled out by provider. Send document back via Fax within 7-days. Document is located in providers tray at front office.          Fax number:  [phone]

## 2023-08-23 ENCOUNTER — Encounter: Payer: Self-pay | Admitting: Family Medicine

## 2023-08-23 ENCOUNTER — Ambulatory Visit (INDEPENDENT_AMBULATORY_CARE_PROVIDER_SITE_OTHER): Admitting: Family Medicine

## 2023-08-23 ENCOUNTER — Telehealth: Payer: Self-pay | Admitting: *Deleted

## 2023-08-23 VITALS — BP 109/74 | HR 92 | Wt 257.2 lb

## 2023-08-23 DIAGNOSIS — I509 Heart failure, unspecified: Secondary | ICD-10-CM

## 2023-08-23 DIAGNOSIS — Z7985 Long-term (current) use of injectable non-insulin antidiabetic drugs: Secondary | ICD-10-CM

## 2023-08-23 DIAGNOSIS — Z7984 Long term (current) use of oral hypoglycemic drugs: Secondary | ICD-10-CM

## 2023-08-23 DIAGNOSIS — Z794 Long term (current) use of insulin: Secondary | ICD-10-CM

## 2023-08-23 DIAGNOSIS — Z09 Encounter for follow-up examination after completed treatment for conditions other than malignant neoplasm: Secondary | ICD-10-CM

## 2023-08-23 DIAGNOSIS — J4520 Mild intermittent asthma, uncomplicated: Secondary | ICD-10-CM | POA: Diagnosis not present

## 2023-08-23 DIAGNOSIS — E1165 Type 2 diabetes mellitus with hyperglycemia: Secondary | ICD-10-CM

## 2023-08-23 DIAGNOSIS — E119 Type 2 diabetes mellitus without complications: Secondary | ICD-10-CM

## 2023-08-23 DIAGNOSIS — N184 Chronic kidney disease, stage 4 (severe): Secondary | ICD-10-CM

## 2023-08-23 MED ORDER — CLONAZEPAM 0.5 MG PO TABS: 0.5000 mg | ORAL_TABLET | ORAL | 0 refills | Status: DC | PRN

## 2023-08-23 MED ORDER — BENZONATATE 100 MG PO CAPS: 100.0000 mg | ORAL_CAPSULE | Freq: Two times a day (BID) | ORAL | 0 refills | Status: DC | PRN

## 2023-08-23 NOTE — Telephone Encounter (Signed)
 Left voicemail for  Myrtie Atkinson to have an extension in treatment for PT.

## 2023-08-23 NOTE — Telephone Encounter (Signed)
 Copied from CRM [id number]. Topic: General - Other >> Aug 23, 2023  2:13 PM Marissa P wrote: Reason for CRM: Physical therapist Myrtie Atkinson is calling for a extension for the evaluation that was suppose to be sent in last week.  Callback# [phone]

## 2023-08-23 NOTE — Progress Notes (Unsigned)
 Established Patient Office Visit  Subjective    Patient ID: Tonya Evans, female    DOB: [birth year]  Age: 68 y.o. MRN: [id number]  CC:  Chief Complaint  Patient presents with  . Hospitalization Follow-up    Medication review     HPI Tonya Evans presents for routine hospital discharge follow up.Patient was admitted to hospital after a previous ED visit was given contrast and developed AKI. She began holding water and developed HF decompensation. She reports that she is much improved having lost 40-5 lbs of water weight during the last hospitalization.   Outpatient Encounter Medications as of 08/23/2023  Medication Sig  . acetaminophen (TYLENOL) 500 MG tablet Take by mouth.  Aaron Aas albuterol (PROVENTIL) (2.5 MG/3ML) 0.083% nebulizer solution Take 2.5 mg by nebulization every 4 (four) hours as needed.  Aaron Aas albuterol (VENTOLIN HFA) 108 (90 Base) MCG/ACT inhaler INHALE 2 PUFFS BY MOUTH EVERY 6 HOURS AS NEEDED FOR COUGHING, WHEEZING, OR SHORTNESS OF BREATH  . apixaban  (ELIQUIS ) 5 MG TABS tablet Take 1 tablet by mouth twice daily  . aspirin 81 MG chewable tablet Chew by mouth.  . atorvastatin  (LIPITOR) 20 MG tablet Take 1 tablet (20 mg total) by mouth daily.  . budesonide  (PULMICORT ) 0.5 MG/2ML nebulizer solution USE 1 VIAL IN NEBULIZER ONCE DAILY  . carvedilol  (COREG ) 25 MG tablet TAKE 1 TABLET BY MOUTH TWICE DAILY WITH A MEAL  . cetirizine  (ZYRTEC ) 10 MG tablet Take 1 tablet (10 mg total) by mouth 2 (two) times daily.  . Cholecalciferol 25 MCG (1000 UT) tablet Take by mouth.  . Continuous Glucose Sensor (FREESTYLE LIBRE 2 SENSOR) MISC PLACE AS DIRECTED EVERY 14 DAYS TO READ BLOOD GLUCOSE  . cyclobenzaprine  (FLEXERIL ) 5 MG tablet Take 1 tablet (5 mg total) by mouth 3 (three) times daily as needed for muscle spasms.  . digoxin  (LANOXIN ) 0.125 MG tablet Take 1 tablet by mouth once daily  . ezetimibe  (ZETIA ) 10 MG tablet TAKE 1 Tablet BY MOUTH ONCE EVERY DAY  . gabapentin  (NEURONTIN ) 600 MG tablet  Take 1 tablet (600 mg total) by mouth 2 (two) times daily.  . hydrALAZINE  (APRESOLINE ) 25 MG tablet Take 1 tablet by mouth twice daily  . HYDROcodone  bit-homatropine (HYCODAN) 5-1.5 MG/5ML syrup Take 5 mLs by mouth every 6 (six) hours as needed for cough.  . insulin  aspart (NOVOLOG ) 100 UNIT/ML FlexPen Inject 15 units SQ 3 times daily before meals.  . iron polysaccharides (NIFEREX) 150 MG capsule Take by mouth.  . isosorbide  mononitrate (IMDUR ) 30 MG 24 hr tablet Take 1 tablet by mouth once daily  . LANTUS  SOLOSTAR 100 UNIT/ML Solostar Pen INJECT 50 UNITS SUBCUTANEOUSLY TWICE DAILY  . losartan  (COZAAR ) 50 MG tablet Take 1 tablet by mouth daily.  . methylPREDNISolone  (MEDROL  DOSEPAK) 4 MG TBPK tablet Take po daily as directed  . MOUNJARO  10 MG/0.5ML Pen INJECT 10 MG INTO THE SKIN  ONCE A WEEK  . mupirocin  ointment (BACTROBAN ) 2 % Apply 1 Application topically 2 (two) times daily.  . omeprazole  (PRILOSEC) 40 MG capsule Take 1 capsule by mouth once daily  . ondansetron  (ZOFRAN ) 4 MG tablet Take 1 tablet (4 mg total) by mouth every 8 (eight) hours as needed for nausea or vomiting.  . spironolactone  (ALDACTONE ) 25 MG tablet Take 1 tablet (25 mg total) by mouth daily.  . triamcinolone  0.1% oint-Eucerin equivalent cream 1:1 mixture Apply topically 2 (two) times daily as needed.  . [DISCONTINUED] benzonatate  (TESSALON ) 100 MG capsule Take 1 capsule (  100 mg total) by mouth 2 (two) times daily as needed for cough.  . [DISCONTINUED] clonazePAM  (KLONOPIN ) 0.5 MG tablet Take 1 tablet (0.5 mg total) by mouth as needed.  . [DISCONTINUED] furosemide  (LASIX ) 40 MG tablet Take 2 tablets by mouth twice daily  . [DISCONTINUED] methocarbamol (ROBAXIN) 500 MG tablet as needed.  . benzonatate  (TESSALON ) 100 MG capsule Take 1 capsule (100 mg total) by mouth 2 (two) times daily as needed for cough.  . clonazePAM  (KLONOPIN ) 0.5 MG tablet Take 1 tablet (0.5 mg total) by mouth as needed.   Facility-Administered Encounter  Medications as of 08/23/2023  Medication  . 0.9 %  sodium chloride  infusion  . promethazine  (PHENERGAN ) injection 25 mg    Past Medical History:  Diagnosis Date  . A-fib (HCC)   . CHF (congestive heart failure) (HCC)   . Constipation   . History of blood clots 04/20/2005  . Hyperlipemia   . Hypertension   . Obesity   . Type II or unspecified type diabetes mellitus without mention of complication, not stated as uncontrolled     Past Surgical History:  Procedure Laterality Date  . APPENDECTOMY  1979  . CARDIAC DEFIBRILLATOR PLACEMENT  2008  . CESAREAN SECTION     x 3   . HERNIA REPAIR  2005  . TONSILLECTOMY      Family History  Problem Relation Age of Onset  . Diabetes Father   . Hypertension Mother   . Diabetes Brother   . Hypertension Sister   . Breast cancer Mother   . Colon cancer Neg Hx     Social History   Socioeconomic History  . Marital status: Legally Separated    Spouse name: Not on file  . Number of children: Not on file  . Years of education: Not on file  . Highest education level: Not on file  Occupational History  . Occupation: Engineer, materials: [employer]  Tobacco Use  . Smoking status: Former  . Smokeless tobacco: Never  Substance and Sexual Activity  . Alcohol use: No  . Drug use: No  . Sexual activity: Not Currently  Other Topics Concern  . Not on file  Social History Narrative   No caffeine drinks    Social Drivers of Corporate investment banker Strain: Low Risk  (06/12/2022)   Overall Financial Resource Strain (CARDIA)   . Difficulty of Paying Living Expenses: Not very hard  Food Insecurity: Low Risk  (08/03/2023)   Received from [facility]   Hunger Vital Sign   . Worried About Programme researcher, broadcasting/film/video in the Last Year: Never true   . Ran Out of Food in the Last Year: Never true  Transportation Needs: No Transportation Needs (08/03/2023)   Received from [facility]   . In the past 12 months, has lack of  reliable transportation kept you from medical appointments, meetings, work or from getting things needed for daily living? : No  Physical Activity: Inactive (06/12/2022)   Exercise Vital Sign   . Days of Exercise per Week: 1 day   . Minutes of Exercise per Session: 0 min  Stress: No Stress Concern Present (06/12/2022)   [employer] of Occupational Health - Occupational Stress Questionnaire   . Feeling of Stress : Not at all  Social Connections: Moderately Integrated (06/12/2022)   Social Connection and Isolation Panel [NHANES]   . Frequency of Communication with Friends and Family: More than three times a  week   . Frequency of Social Gatherings with Friends and Family: More than three times a week   . Attends Religious Services: More than 4 times per year   . Active Member of Clubs or Organizations: Yes   . Attends Banker Meetings: More than 4 times per year   . Marital Status: Separated  Intimate Partner Violence: Not At Risk (07/30/2023)   Humiliation, Afraid, Rape, and Kick questionnaire   . Fear of Current or Ex-Partner: No   . Emotionally Abused: No   . Physically Abused: No   . Sexually Abused: No    Review of Systems  All other systems reviewed and are negative.       Objective    BP 109/74 (BP Location: Right Arm, Patient Position: Sitting, Cuff Size: Normal) Comment (BP Location): forearm  Pulse 92   Wt 257 lb 3.2 oz (116.7 kg)   SpO2 96%   BMI 44.85 kg/m   Physical Exam Vitals and nursing note reviewed.  Constitutional:      General: She is not in acute distress.    Appearance: She is obese.  Cardiovascular:     Rate and Rhythm: Normal rate and regular rhythm.  Pulmonary:     Effort: Pulmonary effort is normal.     Breath sounds: Normal breath sounds.  Abdominal:     Palpations: Abdomen is soft.     Tenderness: There is no abdominal tenderness.  Musculoskeletal:     Right lower leg: No edema.     Left lower leg: No edema.     Comments:  Utilizing w/c for stability  Neurological:     General: No focal deficit present.     Mental Status: She is alert and oriented to person, place, and time.  Psychiatric:        Mood and Affect: Mood and affect normal.        Behavior: Behavior normal.        Thought Content: Thought content normal.        Assessment & Plan:   Hospital discharge follow-up  Type 2 diabetes mellitus with hyperglycemia, with long-term current use of insulin  (HCC)  Chronic heart failure, unspecified heart failure type (HCC)  Mild intermittent chronic asthma without complication  Stage 4 chronic kidney disease (HCC)  Diabetes mellitus treated with oral medication (HCC)  Encounter for long-term (current) use of insulin  (HCC)  Long-term current use of injectable noninsulin antidiabetic medication  Other orders -     Benzonatate ; Take 1 capsule (100 mg total) by mouth 2 (two) times daily as needed for cough.  Dispense: 30 capsule; Refill: 0 -     clonazePAM ; Take 1 tablet (0.5 mg total) by mouth as needed.  Dispense: 30 tablet; Refill: 0     Return in about 3 months (around 11/23/2023) for follow up, chronic med issues.   Arlo Lama, MD

## 2023-08-24 ENCOUNTER — Encounter: Payer: Self-pay | Admitting: Family Medicine

## 2023-08-25 ENCOUNTER — Telehealth: Payer: Self-pay | Admitting: Family Medicine

## 2023-08-25 NOTE — Telephone Encounter (Signed)
 Noted.

## 2023-08-25 NOTE — Telephone Encounter (Signed)
 A document form from [facility] has been faxed: Home Health Certificate (Order ID [id number]), to be filled out by provider. Send document back via Fax within 7-days. Document is located in providers tray at front office.          Fax number:  [phone]

## 2023-08-26 ENCOUNTER — Telehealth: Payer: Self-pay | Admitting: Emergency Medicine

## 2023-08-26 ENCOUNTER — Telehealth: Payer: Self-pay | Admitting: Family Medicine

## 2023-08-26 NOTE — Telephone Encounter (Signed)
 error

## 2023-08-26 NOTE — Telephone Encounter (Signed)
 A document form from [facility] has been faxed: Home Health Certificate (Order ID [id number]), to be filled out by provider. Send document back via Fax within 7-days. Document is located in providers tray at front office.          Fax number: [phone]

## 2023-08-30 ENCOUNTER — Telehealth: Payer: Self-pay | Admitting: Family Medicine

## 2023-08-30 NOTE — Telephone Encounter (Signed)
 A document form from [facility] has been faxed: Home Health Certificate (Order ID [id number]), to be filled out by provider. Send document back via Fax within 7-days. Document is located in providers tray at front office.          Fax number: [phone]

## 2023-09-01 ENCOUNTER — Telehealth: Payer: Self-pay | Admitting: Family Medicine

## 2023-09-01 NOTE — Telephone Encounter (Signed)
 A document form from [facility] has been faxed: Home Health Certificate (Order [place] [id number]), to be filled out by provider. Send document back via Fax within 7-days. Document is located in providers tray at front office.          Fax number: [phone]

## 2023-09-01 NOTE — Telephone Encounter (Signed)
 noted

## 2023-09-01 NOTE — Telephone Encounter (Signed)
 Noted.

## 2023-09-16 ENCOUNTER — Telehealth: Payer: Self-pay

## 2023-09-16 NOTE — Transitions of Care (Post Inpatient/ED Visit) (Signed)
   09/16/2023  Name: Tonya Evans MRN: [id number] DOB: 1955-06-20  Today's TOC FU Call Status: Today's TOC FU Call Status:: Successful TOC FU Call Completed TOC FU Call Complete Date: 09/16/23 (Spoke with patient who declined TOC program and states she has people coming to see her - Per chart review, Patient has [facility], OT, SN, and hospitalist at home - Declined TOC) Patient's Name and Date of Birth confirmed.  Transition Care Management Follow-up Telephone Call Date of Discharge: 09/15/23 Discharge Facility: Other ([facility]) Name of Other ([facility]) [facility]charge Facility: [facility] Type of Discharge: Inpatient Admission Primary Inpatient Discharge Diagnosis:: CHF (congestive heart failure), NYHA class III, acute on chronic, systolic   Tonia Frankel RN, CCM [facility]  VBCI-Population Health RN Care Manager [phone]

## 2023-09-20 ENCOUNTER — Other Ambulatory Visit: Payer: Self-pay | Admitting: Family Medicine

## 2023-09-21 ENCOUNTER — Other Ambulatory Visit: Payer: Self-pay | Admitting: Family

## 2023-09-21 DIAGNOSIS — I509 Heart failure, unspecified: Secondary | ICD-10-CM

## 2023-09-21 MED ORDER — DIGOXIN 125 MCG PO TABS: 125.0000 ug | ORAL_TABLET | Freq: Every day | ORAL | 0 refills | Status: DC

## 2023-09-21 NOTE — Telephone Encounter (Signed)
 Digoxin  prescribed 09/21/2023.

## 2023-09-23 ENCOUNTER — Telehealth: Payer: Self-pay

## 2023-09-23 NOTE — Telephone Encounter (Signed)
 Called and spoke with Tonya Evans and did verbal orders. Patient needs appointment for medication management and Hosp. F/u  Can either one of you schedule this for me with Elvan Hamel

## 2023-09-23 NOTE — Telephone Encounter (Signed)
 Copied from CRM [id number]. Topic: Clinical - Home Health Verbal Orders >> Sep 23, 2023  2:14 PM Baldemar Lev wrote: Caller/Agency: April from [facility]  Callback Number: [phone] Service Requested: Skilled Nursing Frequency:   Recert.  Weekly 4 weeks. Once every other week for 4 weeks   Any new concerns about the patient?   Pt's daughter and patient state that her Humalog was changed to Novolog   Lantus  was changed from 20 units to 15 units daily -needs to confirm this?

## 2023-09-24 ENCOUNTER — Telehealth: Payer: Self-pay

## 2023-09-24 NOTE — Telephone Encounter (Signed)
 Copied from CRM [id number]. Topic: Clinical - Home Health Verbal Orders >> Sep 24, 2023  1:40 PM Fredrica W wrote: Caller/Agency: Milon Aloe  Callback Number: [phone] vm confidential  Service Requested: Physical Therapy Frequency: N/A - was not able to see pt this week due to conflicting schedule needs order for evaluation moved to next week with start period probably Wednesday or Thursday Any new concerns about the patient? N/A

## 2023-09-24 NOTE — Telephone Encounter (Signed)
 Called patient left voicemail to call office back to schedule hospital follow up

## 2023-09-28 NOTE — Telephone Encounter (Signed)
 Called Tonya Evans and did verbal orders

## 2023-09-29 ENCOUNTER — Telehealth: Payer: Self-pay | Admitting: Family Medicine

## 2023-09-29 NOTE — Telephone Encounter (Signed)
 noted

## 2023-09-29 NOTE — Telephone Encounter (Signed)
 A document form from Tonya Evans has been faxed: Home Health Certificate (Order ID [id number]), to be filled out by provider. Send document back via Fax within 7-days. Document is located in providers tray at front office.          Fax number: [phone]

## 2023-09-29 NOTE — Telephone Encounter (Signed)
 A document form from [facility] has been faxed: Home Health Certificate (Order [place] [id number]), to be filled out by provider. Send document back via Fax within 7-days. Document is located in providers tray at front office.          Fax number: [phone]

## 2023-09-30 ENCOUNTER — Telehealth: Payer: Self-pay | Admitting: Family Medicine

## 2023-09-30 ENCOUNTER — Ambulatory Visit (INDEPENDENT_AMBULATORY_CARE_PROVIDER_SITE_OTHER): Admitting: Family Medicine

## 2023-09-30 ENCOUNTER — Encounter: Payer: Self-pay | Admitting: Family Medicine

## 2023-09-30 VITALS — HR 98 | Wt 238.0 lb

## 2023-09-30 DIAGNOSIS — Z09 Encounter for follow-up examination after completed treatment for conditions other than malignant neoplasm: Secondary | ICD-10-CM

## 2023-09-30 DIAGNOSIS — I509 Heart failure, unspecified: Secondary | ICD-10-CM | POA: Diagnosis not present

## 2023-09-30 DIAGNOSIS — G8929 Other chronic pain: Secondary | ICD-10-CM

## 2023-09-30 DIAGNOSIS — N184 Chronic kidney disease, stage 4 (severe): Secondary | ICD-10-CM

## 2023-09-30 DIAGNOSIS — Z794 Long term (current) use of insulin: Secondary | ICD-10-CM

## 2023-09-30 DIAGNOSIS — Z6841 Body Mass Index (BMI) 40.0 and over, adult: Secondary | ICD-10-CM

## 2023-09-30 DIAGNOSIS — M25562 Pain in left knee: Secondary | ICD-10-CM

## 2023-09-30 DIAGNOSIS — E66813 Obesity, class 3: Secondary | ICD-10-CM

## 2023-09-30 DIAGNOSIS — E1169 Type 2 diabetes mellitus with other specified complication: Secondary | ICD-10-CM

## 2023-09-30 MED ORDER — TIRZEPATIDE 5 MG/0.5ML ~~LOC~~ SOAJ: 5.0000 mg | SUBCUTANEOUS | 2 refills | Status: DC

## 2023-09-30 NOTE — Telephone Encounter (Signed)
 A document form from [facility] has been faxed: orders for signature, to be filled out by provider. Send document back via Fax within 7-days. Document is located in providers tray at front office.           Fax number: [phone]

## 2023-09-30 NOTE — Progress Notes (Signed)
 Established Patient Office Visit  Subjective    Patient ID: Tonya Evans, female    DOB: 1955-08-11  Age: 68 y.o. MRN: [id number]  CC:  Chief Complaint  Patient presents with   Hospitalization Follow-up   Med Change Request    HPI Tonya Evans presents for routine hospital discharge follow up. Patient has been in the hospital multiple times including [facility] at [facility] program . She has had AKI 2/2 dye and then was put into fulminant HF 2/2 fluids to preserve kidneys. Presently she is down 60 lbs to a dry weight of 234 lbs which has been her lowest in many years. Patient is wanting to restart monjauro and also to get an injection in her left knee.   Outpatient Encounter Medications as of 09/30/2023  Medication Sig   acetaminophen (TYLENOL) 500 MG tablet Take by mouth.   albuterol (PROVENTIL) (2.5 MG/3ML) 0.083% nebulizer solution Take 2.5 mg by nebulization every 4 (four) hours as needed.   albuterol (VENTOLIN HFA) 108 (90 Base) MCG/ACT inhaler INHALE 2 PUFFS BY MOUTH EVERY 6 HOURS AS NEEDED FOR COUGHING, WHEEZING, OR SHORTNESS OF BREATH   apixaban  (ELIQUIS ) 5 MG TABS tablet Take 1 tablet by mouth twice daily   aspirin 81 MG chewable tablet Chew by mouth.   atorvastatin  (LIPITOR) 20 MG tablet Take 1 tablet (20 mg total) by mouth daily.   benzonatate  (TESSALON ) 100 MG capsule Take 1 capsule (100 mg total) by mouth 2 (two) times daily as needed for cough.   budesonide  (PULMICORT ) 0.5 MG/2ML nebulizer solution USE 1 VIAL IN NEBULIZER ONCE DAILY   carvedilol  (COREG ) 25 MG tablet TAKE 1 TABLET BY MOUTH TWICE DAILY WITH A MEAL   cetirizine  (ZYRTEC ) 10 MG tablet Take 1 tablet (10 mg total) by mouth 2 (two) times daily.   Cholecalciferol 25 MCG (1000 UT) tablet Take by mouth.   clonazePAM  (KLONOPIN ) 0.5 MG tablet Take 1 tablet (0.5 mg total) by mouth as needed.   Continuous Glucose Sensor (FREESTYLE LIBRE 2 SENSOR) MISC PLACE AS DIRECTED EVERY 14 DAYS TO READ BLOOD GLUCOSE    cyclobenzaprine  (FLEXERIL ) 5 MG tablet Take 1 tablet (5 mg total) by mouth 3 (three) times daily as needed for muscle spasms.   digoxin  (LANOXIN ) 0.125 MG tablet Take 1 tablet (125 mcg total) by mouth daily.   ezetimibe  (ZETIA ) 10 MG tablet TAKE 1 Tablet BY MOUTH ONCE EVERY DAY   gabapentin  (NEURONTIN ) 600 MG tablet Take 1 tablet (600 mg total) by mouth 2 (two) times daily.   hydrALAZINE  (APRESOLINE ) 25 MG tablet Take 1 tablet by mouth twice daily   HYDROcodone  bit-homatropine (HYCODAN) 5-1.5 MG/5ML syrup Take 5 mLs by mouth every 6 (six) hours as needed for cough.   insulin  aspart (NOVOLOG ) 100 UNIT/ML FlexPen Inject 15 units SQ 3 times daily before meals.   iron polysaccharides (NIFEREX) 150 MG capsule Take by mouth.   isosorbide  mononitrate (IMDUR ) 30 MG 24 hr tablet Take 1 tablet by mouth once daily   LANTUS  SOLOSTAR 100 UNIT/ML Solostar Pen INJECT 50 UNITS SUBCUTANEOUSLY TWICE DAILY   losartan  (COZAAR ) 50 MG tablet Take 1 tablet by mouth daily.   methylPREDNISolone  (MEDROL  DOSEPAK) 4 MG TBPK tablet Take po daily as directed   MOUNJARO  10 MG/0.5ML Pen INJECT 10 MG INTO THE SKIN  ONCE A WEEK   mupirocin  ointment (BACTROBAN ) 2 % Apply 1 Application topically 2 (two) times daily.   omeprazole  (PRILOSEC) 40 MG capsule Take 1 capsule by mouth once  daily   ondansetron  (ZOFRAN ) 4 MG tablet Take 1 tablet (4 mg total) by mouth every 8 (eight) hours as needed for nausea or vomiting.   spironolactone  (ALDACTONE ) 25 MG tablet Take 1 tablet (25 mg total) by mouth daily.   tirzepatide  (MOUNJARO ) 5 MG/0.5ML Pen Inject 5 mg into the skin once a week.   triamcinolone  0.1% oint-Eucerin equivalent cream 1:1 mixture Apply topically 2 (two) times daily as needed.   Facility-Administered Encounter Medications as of 09/30/2023  Medication   0.9 %  sodium chloride  infusion   promethazine  (PHENERGAN ) injection 25 mg    Past Medical History:  Diagnosis Date   A-fib (HCC)    CHF (congestive heart failure) (HCC)     Constipation    History of blood clots 04/20/2005   Hyperlipemia    Hypertension    Obesity    Type II or unspecified type diabetes mellitus without mention of complication, not stated as uncontrolled     Past Surgical History:  Procedure Laterality Date   APPENDECTOMY  1979   CARDIAC DEFIBRILLATOR PLACEMENT  2008   CESAREAN SECTION     x 3    HERNIA REPAIR  2005   TONSILLECTOMY      Family History  Problem Relation Age of Onset   Diabetes Father    Hypertension Mother    Diabetes Brother    Hypertension Sister    Breast cancer Mother    Colon cancer Neg Hx     Social History   Socioeconomic History   Marital status: Legally Separated    Spouse name: Not on file   Number of children: Not on file   Years of education: Not on file   Highest education level: Not on file  Occupational History   Occupation: pastor    Employer: [employer]  Tobacco Use   Smoking status: Former   Smokeless tobacco: Never  Substance and Sexual Activity   Alcohol use: No   Drug use: No   Sexual activity: Not Currently  Other Topics Concern   Not on file  Social History Narrative   No caffeine drinks    Social Drivers of Corporate investment banker Strain: Low Risk  (06/12/2022)   Overall Financial Resource Strain (CARDIA)    Difficulty of Paying Living Expenses: Not very hard  Food Insecurity: Low Risk  (08/31/2023)   Received from [facility]   Hunger Vital Sign    Within the past 12 months, you worried that your food would run out before you got money to buy more: Never true    Within the past 12 months, the food you bought just didn't last and you didn't have money to get more. : Never true  Transportation Needs: No Transportation Needs (08/31/2023)   Received from [facility]    In the past 12 months, has lack of reliable transportation kept you from medical appointments, meetings, work or from getting things needed for daily living? : No   Physical Activity: Inactive (06/12/2022)   Exercise Vital Sign    Days of Exercise per Week: 1 day    Minutes of Exercise per Session: 0 min  Stress: No Stress Concern Present (06/12/2022)   [employer] of Occupational Health - Occupational Stress Questionnaire    Feeling of Stress : Not at all  Social Connections: Moderately Integrated (06/12/2022)   Social Connection and Isolation Panel    Frequency of Communication with Friends and Family: More than three times  a week    Frequency of Social Gatherings with Friends and Family: More than three times a week    Attends Religious Services: More than 4 times per year    Active Member of [employer] or Organizations: Yes    Attends Banker Meetings: More than 4 times per year    Marital Status: Separated  Intimate Partner Violence: Not At Risk (07/30/2023)   Humiliation, Afraid, Rape, and Kick questionnaire    Fear of Current or Ex-Partner: No    Emotionally Abused: No    Physically Abused: No    Sexually Abused: No    Review of Systems  All other systems reviewed and are negative.       Objective    Pulse 98   Wt 238 lb (108 kg)   SpO2 96%   BMI 41.50 kg/m   Physical Exam Vitals and nursing note reviewed.  Constitutional:      General: She is not in acute distress.    Appearance: She is obese.   Cardiovascular:     Rate and Rhythm: Normal rate and regular rhythm.  Pulmonary:     Effort: Pulmonary effort is normal.     Breath sounds: Normal breath sounds.  Abdominal:     Palpations: Abdomen is soft.     Tenderness: There is no abdominal tenderness.   Musculoskeletal:     Right lower leg: No edema.     Left lower leg: No edema.     Comments: Utilizing w/c for stability   Neurological:     General: No focal deficit present.     Mental Status: She is alert and oriented to person, place, and time.   Psychiatric:        Mood and Affect: Mood and affect normal.        Behavior: Behavior normal.         Thought Content: Thought content normal.         Assessment & Plan:   1. Chronic pain of left knee (Primary) Patient to schedule for steroid injection with consultant  2. Type 2 diabetes mellitus with other specified complication, with long-term current use of insulin  (HCC) Recent A1c still slightly above goal. Will restart monjauro at 5 mg weekly  3. Stage 4 chronic kidney disease (HCC) Improved. Management per consultant  4. Chronic heart failure, unspecified heart failure type (HCC) Management per consultant  5. Class 3 severe obesity due to excess calories with serious comorbidity and body mass index (BMI) of 40.0 to 44.9 in adult Extensive fluid/weight loss  6. Hospital discharge follow-up Improving. Continues with [facility] program    Return in about 3 months (around 12/31/2023) for follow up, chronic med issues.   Arlo Lama, MD

## 2023-10-01 ENCOUNTER — Encounter: Payer: Self-pay | Admitting: Family Medicine

## 2023-10-01 NOTE — Telephone Encounter (Signed)
 noted

## 2023-10-06 ENCOUNTER — Telehealth: Payer: Self-pay | Admitting: Family Medicine

## 2023-10-06 NOTE — Telephone Encounter (Signed)
 A document form from [facility] has been faxed: Home Health Certificate (Order [place] [id number]), to be filled out by provider. Send document back via Fax within 7-days to 10 business days. Document is located in providers tray at front office.          Fax number: [phone]

## 2023-10-06 NOTE — Telephone Encounter (Signed)
 Noted

## 2023-10-07 ENCOUNTER — Telehealth: Payer: Self-pay | Admitting: Family Medicine

## 2023-10-07 NOTE — Telephone Encounter (Signed)
 A document form from [facility] has been faxed: Home Health Certificate (Order ID [id number]), to be filled out by provider. Send document back via Fax within 7-days to 10 business days. Document is located in providers tray at front office.          Fax number: [phone]

## 2023-10-08 NOTE — Telephone Encounter (Signed)
 Noted

## 2023-10-09 ENCOUNTER — Other Ambulatory Visit: Payer: Self-pay | Admitting: Family Medicine

## 2023-10-24 ENCOUNTER — Other Ambulatory Visit: Payer: Self-pay | Admitting: Family Medicine

## 2023-10-27 ENCOUNTER — Telehealth: Payer: Self-pay | Admitting: *Deleted

## 2023-10-27 NOTE — Transitions of Care (Post Inpatient/ED Visit) (Signed)
   10/27/2023  Name: Tonya Evans MRN: [id number] DOB: [birth year]  Today's TOC FU Call Status: Today's TOC FU Call Status:: Unsuccessful Call (1st Attempt) Unsuccessful Call (1st Attempt) Date: 10/27/23  Attempted to reach the patient regarding the most recent Inpatient/ED visit.  Follow Up Plan: Additional outreach attempts will be made to reach the patient to complete the Transitions of Care (Post Inpatient/ED visit) call.   Andrea Dimes RN, BSN [place]  [facility] RN Care Manager [phone]

## 2023-10-28 ENCOUNTER — Telehealth: Payer: Self-pay | Admitting: Emergency Medicine

## 2023-10-28 ENCOUNTER — Telehealth: Payer: Self-pay | Admitting: *Deleted

## 2023-10-28 NOTE — Telephone Encounter (Signed)
 I called April back however no one answered so I left a voicemail to return my call

## 2023-10-28 NOTE — Transitions of Care (Post Inpatient/ED Visit) (Signed)
 10/28/2023  Name: Tonya Evans MRN: [id number] DOB: 10/04/1955  Today's TOC FU Call Status: Today's TOC FU Call Status:: Successful TOC FU Call Completed TOC FU Call Complete Date: 10/28/23 Patient's Name and Date of Birth confirmed.  Transition Care Management Follow-up Telephone Call Date of Discharge: 10/26/23 Discharge Facility: Other ([facility]) Name of Other ([facility]) Discharge Facility: [facility] Type of Discharge: Inpatient Admission Primary Inpatient Discharge Diagnosis:: Altered Mental Status How have you been since you were released from the hospital?: Better Any questions or concerns?: No  Items Reviewed: Did you receive and understand the discharge instructions provided?: Yes Medications obtained,verified, and reconciled?: Yes (Medications Reviewed) Any new allergies since your discharge?: No Dietary orders reviewed?: Yes Type of Diet Ordered:: Heart healthy, carb controlled Do you have support at home?: Yes People in Home [RPT]: friend(s) Name of Support/Comfort Primary Source: [place]/Daughter  Medications Reviewed Today: Medications Reviewed Today     Reviewed by Lucky Andrea LABOR, RN (Registered Nurse) on 10/28/23 at 1605  Med List Status: <None>   Medication Order Taking? Sig Documenting Provider Last Dose Status Informant  0.9 %  sodium chloride  infusion 61014505   Jakie Alm SAUNDERS, MD  Active   acetaminophen (TYLENOL) 500 MG tablet [id number] Yes Take by mouth. [provider]  Active   albuterol (PROVENTIL) (2.5 MG/3ML) 0.083% nebulizer solution [id number] Yes Take 2.5 mg by nebulization every 4 (four) hours as needed. [provider]  Active   albuterol (VENTOLIN HFA) 108 (90 Base) MCG/ACT inhaler [id number] Yes INHALE 2 PUFFS BY MOUTH EVERY 6 HOURS AS NEEDED FOR COUGHING, WHEEZING, OR SHORTNESS OF BREATH [provider]  Active   apixaban  (ELIQUIS ) 5 MG TABS tablet [id number] Yes Take 1 tablet by mouth twice daily Tanda Bleacher, MD  Active   aspirin 81 MG chewable tablet [id number] Yes Chew by mouth. [provider]  Active   atorvastatin  (LIPITOR) 20 MG tablet [id number] Yes Take 1 tablet (20 mg total) by mouth daily. Tanda Bleacher, MD  Active   benzonatate  (TESSALON ) 100 MG capsule [id number]  Take 1 capsule (100 mg total) by mouth 2 (two) times daily as needed for cough.  Patient not taking: Reported on 10/28/2023   Tanda Bleacher, MD  Active   budesonide  (PULMICORT ) 0.5 MG/2ML nebulizer solution [id number] Yes USE 1 VIAL IN NEBULIZER ONCE DAILY Tanda Bleacher, MD  Active   carvedilol  (COREG ) 25 MG tablet [id number]  TAKE 1 TABLET BY MOUTH TWICE DAILY WITH A MEAL  Patient not taking: Reported on 10/28/2023   Tanda Bleacher, MD  Active   cetirizine  (ZYRTEC ) 10 MG tablet [id number] Yes Take 1 tablet (10 mg total) by mouth 2 (two) times daily. Tanda Bleacher, MD  Active   Cholecalciferol 25 MCG (1000 UT) tablet [id number] Yes Take by mouth. [provider]  Active   clonazePAM  (KLONOPIN ) 0.5 MG tablet [id number] Yes Take 1 tablet (0.5 mg total) by mouth as needed. Tanda Bleacher, MD  Active   Continuous Glucose Sensor (FREESTYLE LIBRE 2 SENSOR) [place] [id number] Yes PLACE AS DIRECTED EVERY 14 DAYS TO READ BLOOD GLUCOSE Tanda Bleacher, MD  Active   cyclobenzaprine  (FLEXERIL ) 5 MG tablet [id number] Yes Take 1 tablet (5 mg total) by mouth 3 (three) times daily as needed for muscle spasms. Tanda Bleacher, MD  Active   digoxin  (LANOXIN ) 0.125 MG tablet [id number]  Take 1 tablet (125 mcg total) by mouth daily.  Patient not taking: Reported on 10/28/2023   Lorren Greig JINNY, NP  Active  ezetimibe  (ZETIA ) 10 MG tablet [id number] Yes Take 1 tablet by mouth once daily Tanda Bleacher, MD  Active   fluticasone (FLONASE) 50 MCG/ACT nasal spray [id number] Yes Place 2 sprays into both nostrils daily. [provider]  Active   gabapentin  (NEURONTIN ) 600 MG tablet [id number] Yes Take 1 tablet (600 mg total) by mouth 2  (two) times daily.  Patient taking differently: Take 300 mg by mouth daily.   Tanda Bleacher, MD  Active   hydrALAZINE  (APRESOLINE ) 25 MG tablet [id number]  Take 1 tablet by mouth twice daily  Patient not taking: Reported on 10/28/2023   Tanda Bleacher, MD  Active   HYDROcodone  bit-homatropine [facility]) 5-1.5 MG/5ML syrup [id number]  Take 5 mLs by mouth every 6 (six) hours as needed for cough.  Patient not taking: Reported on 10/28/2023   Tanda Bleacher, MD  Active   insulin  aspart (NOVOLOG ) 100 UNIT/ML FlexPen [id number] Yes Inject 15 units SQ 3 times daily before meals.  Patient taking differently: Inject 15 units SQ 3 times daily before meals.   Tanda Bleacher, MD  Active   iron polysaccharides (NIFEREX) 150 MG capsule [id number]  Take by mouth.  Patient not taking: Reported on 10/28/2023   [provider]  Active   isosorbide  mononitrate (IMDUR ) 30 MG 24 hr tablet [id number]  Take 1 tablet by mouth once daily  Patient not taking: Reported on 10/28/2023   Tanda Bleacher, MD  Active   LANTUS  SOLOSTAR 100 UNIT/ML Solostar Pen [id number] Yes INJECT 50 UNITS SUBCUTANEOUSLY TWICE DAILY  Patient taking differently: 25 Units daily. INJECT 50 UNITS SUBCUTANEOUSLY TWICE DAILY   Tanda Bleacher, MD  Active   losartan  (COZAAR ) 50 MG tablet [id number]  Take 1 tablet by mouth daily.  Patient not taking: Reported on 10/28/2023   [provider]  Active   methylPREDNISolone  (MEDROL  DOSEPAK) 4 MG TBPK tablet [id number]  Take po daily as directed Tanda Bleacher, MD  Consider Medication Status and Discontinue (Completed Course)   metolazone (ZAROXOLYN) 2.5 MG tablet [id number] Yes Take 2.5 mg by mouth daily. [provider]  Active   MOUNJARO  10 MG/0.5ML Pen [id number]  INJECT 10 MG INTO THE SKIN  ONCE A WEEK  Patient not taking: Reported on 10/28/2023   Tanda Bleacher, MD  Active   mupirocin  ointment (BACTROBAN ) 2 % [id number]  Apply 1 Application topically 2 (two) times daily.  Patient not  taking: Reported on 10/28/2023   Tanda Bleacher, MD  Active   omeprazole  [facility]) 40 MG capsule [id number] Yes Take 1 capsule by mouth once daily Tanda Bleacher, MD  Active   ondansetron  (ZOFRAN ) 4 MG tablet [id number]  Take 1 tablet (4 mg total) by mouth every 8 (eight) hours as needed for nausea or vomiting.  Patient not taking: Reported on 10/28/2023   Tanda Bleacher, MD  Active   promethazine  (PHENERGAN ) injection 25 mg [id number]   Tanda Bleacher, MD  Active   spironolactone  (ALDACTONE ) 25 MG tablet [id number]  Take 1 tablet (25 mg total) by mouth daily.  Patient not taking: Reported on 10/28/2023   Tanda Bleacher, MD  Active   tirzepatide  (MOUNJARO ) 5 MG/0.5ML Pen [id number]  Inject 5 mg into the skin once a week.  Patient not taking: Reported on 10/28/2023   Tanda Bleacher, MD  Active   Torsemide 40 MG TABS [id number] Yes Take 40 mg by mouth 2 (two) times daily. [provider]  Active   triamcinolone  0.1% oint-Eucerin equivalent cream 1:1 mixture [id number] Yes Apply topically 2 (two)  times daily as needed. Tanda Bleacher, MD  Active             Home Care and Equipment/Supplies: Were Home Health Services Ordered?: Yes Name of Home Health Agency:: [facility] Has Agency set up a time to come to your home?: Yes First Home Health Visit Date: 10/28/23 Any new equipment or medical supplies ordered?: No  Functional Questionnaire: Do you need assistance with bathing/showering or dressing?: Yes (needs assistance on bad days) Do you need assistance with meal preparation?: Yes (assisted by daughter) Do you need assistance with eating?: No Do you have difficulty maintaining continence: Yes Do you need assistance with getting out of bed/getting out of a chair/moving?: Yes Do you have difficulty managing or taking your medications?: Yes (managed by daughter)  Follow up appointments reviewed: PCP Follow-up appointment confirmed?: Yes Date of PCP follow-up appointment?: 10/29/23 Follow-up  Provider: Dr. Tanda Specialist [facility] Follow-up appointment confirmed?: NA Do you need transportation to your follow-up appointment?: No Do you understand care options if your condition(s) worsen?: Yes-patient verbalized understanding  SDOH Interventions Today    Flowsheet Row Most Recent Value  SDOH Interventions   Food Insecurity Interventions Intervention Not Indicated  Housing Interventions Intervention Not Indicated  Transportation Interventions Intervention Not Indicated  Utilities Interventions Intervention Not Indicated   RNCM advised taking medications and current list from discharge to the hospital follow up with Dr. Tanda on 10/29/23.  Andrea Dimes RN, BSN [place]  [facility] RN Care Manager [phone]

## 2023-10-28 NOTE — Transitions of Care (Post Inpatient/ED Visit) (Signed)
   10/28/2023  Name: KAHEALANI YANKOVICH MRN: [id number] DOB: 1956-03-29  Today's TOC FU Call Status: Today's TOC FU Call Status:: Unsuccessful Call (2nd Attempt) Unsuccessful Call (2nd Attempt) Date: 10/28/23  Attempted to reach the patient regarding the most recent Inpatient/ED visit.  Ms. Maclaren was unable to take the call at this time and prefers a call back after 3pm.  Follow Up Plan: Additional outreach attempts will be made to reach the patient to complete the Transitions of Care (Post Inpatient/ED visit) call.   Andrea Dimes RN, BSN [place]  [facility] RN Care Manager [phone]

## 2023-10-29 ENCOUNTER — Ambulatory Visit (INDEPENDENT_AMBULATORY_CARE_PROVIDER_SITE_OTHER): Admitting: Family Medicine

## 2023-10-29 VITALS — BP 150/73 | HR 91 | Ht 63.5 in | Wt 233.4 lb

## 2023-10-29 DIAGNOSIS — R319 Hematuria, unspecified: Secondary | ICD-10-CM

## 2023-10-29 DIAGNOSIS — E66813 Obesity, class 3: Secondary | ICD-10-CM

## 2023-10-29 DIAGNOSIS — Z09 Encounter for follow-up examination after completed treatment for conditions other than malignant neoplasm: Secondary | ICD-10-CM

## 2023-10-29 DIAGNOSIS — N184 Chronic kidney disease, stage 4 (severe): Secondary | ICD-10-CM

## 2023-10-29 DIAGNOSIS — N390 Urinary tract infection, site not specified: Secondary | ICD-10-CM

## 2023-10-29 DIAGNOSIS — E1169 Type 2 diabetes mellitus with other specified complication: Secondary | ICD-10-CM | POA: Diagnosis not present

## 2023-10-29 DIAGNOSIS — Z6841 Body Mass Index (BMI) 40.0 and over, adult: Secondary | ICD-10-CM

## 2023-10-29 DIAGNOSIS — Z794 Long term (current) use of insulin: Secondary | ICD-10-CM

## 2023-10-29 MED ORDER — FLUCONAZOLE 150 MG PO TABS: 150.0000 mg | ORAL_TABLET | Freq: Once | ORAL | 0 refills | Status: AC

## 2023-10-29 MED ORDER — SULFAMETHOXAZOLE-TRIMETHOPRIM 800-160 MG PO TABS: 1.0000 | ORAL_TABLET | Freq: Two times a day (BID) | ORAL | 0 refills | Status: DC

## 2023-10-29 NOTE — Progress Notes (Unsigned)
 Established Patient Office Visit  Subjective    Patient ID: Tonya Evans, female    DOB: [birth year]  Age: 68 y.o. MRN: [id number]  CC:  Chief Complaint  Patient presents with   Hospitalization Follow-up    HPI Tonya Evans presents for routine hospital discharge follow up. Patient was admitted for dehydration with acute encephalopathy. Patient had had diarrhea for several days. Denies fever/chills or viral sx. Patient reports improvement.   Outpatient Encounter Medications as of 10/29/2023  Medication Sig   acetaminophen (TYLENOL) 500 MG tablet Take by mouth.   albuterol (PROVENTIL) (2.5 MG/3ML) 0.083% nebulizer solution Take 2.5 mg by nebulization every 4 (four) hours as needed.   albuterol (VENTOLIN HFA) 108 (90 Base) MCG/ACT inhaler INHALE 2 PUFFS BY MOUTH EVERY 6 HOURS AS NEEDED FOR COUGHING, WHEEZING, OR SHORTNESS OF BREATH   apixaban  (ELIQUIS ) 5 MG TABS tablet Take 1 tablet by mouth twice daily   aspirin 81 MG chewable tablet Chew by mouth.   atorvastatin  (LIPITOR) 20 MG tablet Take 1 tablet (20 mg total) by mouth daily.   budesonide  (PULMICORT ) 0.5 MG/2ML nebulizer solution USE 1 VIAL IN NEBULIZER ONCE DAILY   cetirizine  (ZYRTEC ) 10 MG tablet Take 1 tablet (10 mg total) by mouth 2 (two) times daily.   Cholecalciferol 25 MCG (1000 UT) tablet Take by mouth.   clonazePAM  (KLONOPIN ) 0.5 MG tablet Take 1 tablet (0.5 mg total) by mouth as needed.   Continuous Glucose Sensor (FREESTYLE LIBRE 2 SENSOR) MISC PLACE AS DIRECTED EVERY 14 DAYS TO READ BLOOD GLUCOSE   cyclobenzaprine  (FLEXERIL ) 5 MG tablet Take 1 tablet (5 mg total) by mouth 3 (three) times daily as needed for muscle spasms.   ezetimibe  (ZETIA ) 10 MG tablet Take 1 tablet by mouth once daily   [EXPIRED] fluconazole  (DIFLUCAN ) 150 MG tablet Take 1 tablet (150 mg total) by mouth once for 1 dose.   fluticasone (FLONASE) 50 MCG/ACT nasal spray Place 2 sprays into both nostrils daily.   gabapentin  (NEURONTIN ) 600 MG tablet Take 1  tablet (600 mg total) by mouth 2 (two) times daily. (Patient taking differently: Take 300 mg by mouth daily.)   insulin  aspart (NOVOLOG ) 100 UNIT/ML FlexPen Inject 15 units SQ 3 times daily before meals. (Patient taking differently: Inject 15 units SQ 3 times daily before meals.)   LANTUS  SOLOSTAR 100 UNIT/ML Solostar Pen INJECT 50 UNITS SUBCUTANEOUSLY TWICE DAILY (Patient taking differently: 25 Units daily. INJECT 50 UNITS SUBCUTANEOUSLY TWICE DAILY)   methylPREDNISolone  (MEDROL  DOSEPAK) 4 MG TBPK tablet Take po daily as directed   metolazone (ZAROXOLYN) 2.5 MG tablet Take 2.5 mg by mouth daily.   omeprazole  (PRILOSEC) 40 MG capsule Take 1 capsule by mouth once daily   sulfamethoxazole -trimethoprim  (BACTRIM  DS) 800-160 MG tablet Take 1 tablet by mouth 2 (two) times daily.   Torsemide 40 MG TABS Take 40 mg by mouth 2 (two) times daily.   triamcinolone  0.1% oint-Eucerin equivalent cream 1:1 mixture Apply topically 2 (two) times daily as needed.   benzonatate  (TESSALON ) 100 MG capsule Take 1 capsule (100 mg total) by mouth 2 (two) times daily as needed for cough. (Patient not taking: Reported on 10/29/2023)   carvedilol  (COREG ) 25 MG tablet TAKE 1 TABLET BY MOUTH TWICE DAILY WITH A MEAL (Patient not taking: Reported on 10/29/2023)   digoxin  (LANOXIN ) 0.125 MG tablet Take 1 tablet (125 mcg total) by mouth daily. (Patient not taking: Reported on 10/29/2023)   hydrALAZINE  (APRESOLINE ) 25 MG tablet Take 1 tablet by  mouth twice daily (Patient not taking: Reported on 10/29/2023)   HYDROcodone  bit-homatropine (HYCODAN) 5-1.5 MG/5ML syrup Take 5 mLs by mouth every 6 (six) hours as needed for cough. (Patient not taking: Reported on 10/29/2023)   iron polysaccharides (NIFEREX) 150 MG capsule Take by mouth. (Patient not taking: Reported on 10/29/2023)   isosorbide  mononitrate (IMDUR ) 30 MG 24 hr tablet Take 1 tablet by mouth once daily (Patient not taking: Reported on 10/29/2023)   losartan  (COZAAR ) 50 MG tablet Take 1  tablet by mouth daily. (Patient not taking: Reported on 10/29/2023)   MOUNJARO  10 MG/0.5ML Pen INJECT 10 MG INTO THE SKIN  ONCE A WEEK (Patient not taking: Reported on 10/29/2023)   mupirocin  ointment (BACTROBAN ) 2 % Apply 1 Application topically 2 (two) times daily. (Patient not taking: Reported on 10/29/2023)   ondansetron  (ZOFRAN ) 4 MG tablet Take 1 tablet (4 mg total) by mouth every 8 (eight) hours as needed for nausea or vomiting. (Patient not taking: Reported on 10/29/2023)   spironolactone  (ALDACTONE ) 25 MG tablet Take 1 tablet (25 mg total) by mouth daily. (Patient not taking: Reported on 10/29/2023)   tirzepatide  (MOUNJARO ) 5 MG/0.5ML Pen Inject 5 mg into the skin once a week. (Patient not taking: Reported on 10/29/2023)   Facility-Administered Encounter Medications as of 10/29/2023  Medication   0.9 %  sodium chloride  infusion   promethazine  (PHENERGAN ) injection 25 mg    Past Medical History:  Diagnosis Date   A-fib (HCC)    CHF (congestive heart failure) (HCC)    Constipation    History of blood clots 04/20/2005   Hyperlipemia    Hypertension    Obesity    Type II or unspecified type diabetes mellitus without mention of complication, not stated as uncontrolled     Past Surgical History:  Procedure Laterality Date   APPENDECTOMY  1979   CARDIAC DEFIBRILLATOR PLACEMENT  2008   CESAREAN SECTION     x 3    HERNIA REPAIR  2005   TONSILLECTOMY      Family History  Problem Relation Age of Onset   Diabetes Father    Hypertension Mother    Diabetes Brother    Hypertension Sister    Breast cancer Mother    Colon cancer Neg Hx     Social History   Socioeconomic History   Marital status: Legally Separated    Spouse name: Not on file   Number of children: Not on file   Years of education: Not on file   Highest education level: Not on file  Occupational History   Occupation: pastor    Employer: [employer]  Tobacco Use   Smoking status: Former   Smokeless  tobacco: Never  Substance and Sexual Activity   Alcohol use: No   Drug use: No   Sexual activity: Not Currently  Other Topics Concern   Not on file  Social History Narrative   No caffeine drinks    Social Drivers of Corporate investment banker Strain: Low Risk  (06/12/2022)   Overall Financial Resource Strain (CARDIA)    Difficulty of Paying Living Expenses: Not very hard  Food Insecurity: No Food Insecurity (10/28/2023)   Hunger Vital Sign    Worried About Running Out of Food in the Last Year: Never true    Ran Out of Food in the Last Year: Never true  Transportation Needs: No Transportation Needs (10/28/2023)   PRAPARE - Administrator, Civil Service (Medical): No  Lack of Transportation (Non-Medical): No  Physical Activity: Inactive (06/12/2022)   Exercise Vital Sign    Days of Exercise per Week: 1 day    Minutes of Exercise per Session: 0 min  Stress: No Stress Concern Present (06/12/2022)   [employer] of Occupational Health - Occupational Stress Questionnaire    Feeling of Stress : Not at all  Social Connections: Moderately Integrated (06/12/2022)   Social Connection and Isolation Panel    Frequency of Communication with Friends and Family: More than three times a week    Frequency of Social Gatherings with Friends and Family: More than three times a week    Attends Religious Services: More than 4 times per year    Active Member of [employer] or Organizations: Yes    Attends Banker Meetings: More than 4 times per year    Marital Status: Separated  Intimate Partner Violence: Patient Unable To Answer (10/28/2023)   Humiliation, Afraid, Rape, and Kick questionnaire    Fear of Current or Ex-Partner: Patient unable to answer    Emotionally Abused: Patient unable to answer    Physically Abused: Patient unable to answer    Sexually Abused: Patient unable to answer    Review of Systems  All other systems reviewed and are negative.        Objective    BP (!) 150/73   Pulse 91   Ht 5' 3.5 (1.613 m)   Wt 233 lb 6.4 oz (105.9 kg)   SpO2 98%   BMI 40.70 kg/m   Physical Exam Vitals and nursing note reviewed.  Constitutional:      General: She is not in acute distress.    Appearance: She is obese.  Cardiovascular:     Rate and Rhythm: Normal rate and regular rhythm.  Pulmonary:     Effort: Pulmonary effort is normal.     Breath sounds: Normal breath sounds.  Abdominal:     Palpations: Abdomen is soft.     Tenderness: There is no abdominal tenderness.  Musculoskeletal:     Right lower leg: No edema.     Left lower leg: No edema.     Comments: Utilizing w/c for stability  Neurological:     General: No focal deficit present.     Mental Status: She is alert and oriented to person, place, and time.  Psychiatric:        Mood and Affect: Mood and affect normal.        Behavior: Behavior normal.        Thought Content: Thought content normal.         Assessment & Plan:   1. Hospital discharge follow-up   2. Type 2 diabetes mellitus with other specified complication, with long-term current use of insulin  (HCC) (Primary) Referral to endo 2/2 fluctuation blood sugars and difficulty controlling/managing - Ambulatory referral to Endocrinology  3. Urinary tract infection with hematuria, site unspecified Most likely cause of sx leading to hospitalization. Patient noted to have UTI with salmonella and  e coli. Both sensitive to Bactrim  which was prescribed.   4. Class 3 severe obesity due to excess calories with serious comorbidity and body mass index (BMI) of 40.0 to 44.9 in adult   5. Stage 4 chronic kidney disease (HCC) GFR at /near baseline. Continue management as per consultant.     No follow-ups on file.   Tanda Raguel SQUIBB, MD

## 2023-11-01 NOTE — Progress Notes (Signed)
 Subjective:  Tonya Evans is a 68 y.o female patient with CKD stage III/early stage IV, history of longstanding type 2 diabetes with NPDR and neuropathy, HTN,HFrEF 15-20% s/p AICD,CAD s/p DES to mid LAD 03/07/2020, chronic atrial fibrillation on Coumadin , obesity, chronic anemia here for 6 month follow up.  I follow her son Lavonia, Eager and husband for CKD as well. Patient had prior baseline creatinine range of 1.1-1.3 for several years and was last at baseline 02/2020. Had left heart cath November, 2021 with creatinine mostly ranged from 1.4-1.8. Has had multiple ED visits since her last office visit in March and April for shortness of breath, gout flare. Admitted in April for chest pain/epigastric pain, AKI, CKD 3. Hospital course complicated by AKI-peak BUN/creatinine on admission 82/7.0 and thought to be due to contrast associated nephropathy in the setting of decreased p.o. intake, losartan , diuretics, improved without renal replacement therapy (with IV fluids), atrial fibrillation, gallstone with distended bladder, chronic anemia, hyponatremia. Discharge BUN/creatinine 61/2.4.  Has since had multiple admissions for HFrEF exacerbation in April discharge weight 251 pounds.  Required dobutamine, Lasix  drip and metolazone.  Discharged on torsemide 60 mg twice daily. Readmitted in May for about 2 weeks with similar presentation. Most recent admission 7/5 to 10/27/2023 for altered mental status, severe diarrhea, E. coli UTI.  Interval follow-up; Most recent labs showed BUN/creatinine 71/2.0, GFR 27 mL/min. Her electrolytes are within normal limits.  Potassium at lower limit of normal 3.5.  On potassium 20 mill equivalents daily. Glucose level up to 253, uric acid level 16.8. PTH 144, vitamin D 61. Hemoglobin trending up to 10.8 with mild lymphopenia. Hemoglobin A1c 7.8%-On Farxiga , Mounjaro . Currently on torsemide (switched from furosemide ) 40 mg twice daily and metolazone 2.5 mg daily.  Has lost about  20 pounds since last hospital admission in April. Most recent uric acid level 16.8, has had recurrent acute gout flares affecting multiple joints in the hands and feet.  Unable to tolerate allopurinol  due to previous allergic reaction and has not been seen by allergy for desensitization.  Also avoiding Uloric given advanced CHF. BP well controlled. No uremic or LUT symptoms. Was seen by rheumatology for left foot gout and referred to allergy for allopurinol  desensitization.  Review of Systems A complete ROS was performed with pertinent positives and negatives noted in the HPI. The remainder of the ROS are negative.  Meds Ordered in [facility]  Medication Sig Dispense Refill  . acetaminophen (Tylenol Extra Strength) 500 mg tablet Take 500 mg by mouth daily as needed for mild pain (1-3).    SABRA albuterol 2.5 mg /3 mL (0.083 %) nebulizer solution Take 2.5 mg by nebulization every 6 (six) hours as needed for wheezing.    SABRA albuterol HFA (PROVENTIL HFA;VENTOLIN HFA;PROAIR HFA) 90 mcg/actuation inhaler Inhale 1 puff every 4 (four) hours as needed.    . apixaban  (ELIQUIS ) 5 mg tab Take 5 mg by mouth 2 (two) times a day.    SABRA aspirin 81 mg chewable tablet Chew 81 mg daily. 100 tablet 11  . atorvastatin  (LIPITOR) 20 mg tablet Take 20 mg by mouth at bedtime.    . blood-glucose meter,receiver,continuous (FreeStyle Libre 3 Reader) by miscellaneous route. Use as directed with compatible sensors to monitor blood sugar levels.    . blood-glucose meter,receiver,continuous (FreeStyle Libre 3 Reader) by miscellaneous route. Use as directed with compatible sensors to monitor blood sugar levels.    . budesonide  (PULMICORT ) 0.5 mg/2 mL nebulizer solution Take 0.5 mg by nebulization daily as needed.    SABRA  cetirizine  (ZyrTEC ) 10 mg tablet Take 10 mg by mouth 2 (two) times a day.    . cholecalciferol (VITAMIN D3) 1,000 unit (25 mcg) tablet Take 1,000 Units by mouth daily.    . clonazePAM  (KlonoPIN ) 0.5 mg tablet Take 0.5  mg by mouth nightly as needed.    . cyclobenzaprine  (FLEXERIL ) 5 mg tablet Take 1 tablet (5 mg total) by mouth 2 (two) times a day as needed for muscle spasms.    . digoxin  (LANOXIN ) 125 mcg (0.125 mg) tablet Take 125 mcg by mouth daily.    . ezetimibe  (ZETIA ) 10 mg tablet Take 10 mg by mouth Once Daily. Indications: excessive fat in the blood 90 tablet 3  . fluconazole  (DIFLUCAN ) 150 mg tablet Take 150 mg by mouth as needed.    . FreeStyle Libre 3 Plus Sensor APPLY 1 DISK TO CHECK GLUCOSE FOR 15 DAYS    . gabapentin  (NEURONTIN ) 600 mg tablet Take 0.5 tablets (300 mg total) by mouth daily. Taking 300 mg bid    . insulin  glargine (Lantus  Solostar U-100 Insulin ) 100 unit/mL (3 mL) pen 18 units in the AM (Patient taking differently: 20 units in the AM)    . insulin  lispro (HumaLOG) 100 unit/mL injection 0-8 units    . omeprazole  (PriLOSEC) 40 mg DR capsule Take 1 capsule by mouth daily.    . potassium chloride (KLOR-CON) 20 mEq ER tablet Take 1 tablet (20 mEq total) by mouth daily. 30 tablet 0  . sulfamethoxazole -trimethoprim  (BACTRIM  DS) 800-160 mg per tablet Take 1 tablet by mouth 2 (two) times a day.    . tirzepatide  (MOUNJARO ) 5 mg/0.5 mL subcutaneous pen injector Inject 5 mg under the skin once a week. Monday    . torsemide (DEMADEX) 20 mg tablet Take 2 tablets (40 mg total) by mouth in the morning and 2 tablets (40 mg total) in the evening. Take demadex 40 mg daily for now- until followwed by PCP in 1 week. 120 tablet 2  . UNABLE TO FIND Take by mouth as needed (15 ml). Med Name: nyquil hbp    . fluticasone propionate (FLONASE) 50 mcg/spray nasal spray Administer 2 sprays into affected nostril(s) daily.    . [Paused] hydrALAZINE  (APRESOLINE ) 25 mg tablet Take 25 mg by mouth 2 (two) times a day for 90 days. Indications: high blood pressure (Patient not taking: Reported on 11/01/2023) 180 tablet 3  . [Paused] isosorbide  mononitrate (IMDUR ) 30 mg 24 hr tablet Take 1 tablet by mouth daily. (Patient  not taking: Reported on 11/01/2023)    . [Paused] losartan  (COZAAR ) 50 mg tablet Take 50 mg by mouth daily. (Patient not taking: Reported on 11/01/2023)    . metOLazone (ZAROXOLYN) 2.5 mg tablet Take 1 tablet (2.5 mg total) by mouth 3 (three) times a week. Take 30-60 mins before the torsemide dose 36 tablet 6  . [Paused] spironolactone  (ALDACTONE ) 50 mg tablet Take 1 tablet by mouth once daily (Patient not taking: Reported on 11/01/2023) 90 tablet 0   No current Epic-ordered facility-administered medications on file.      Objective:     Vitals:   11/01/23 1200  BP: 136/70  BP Location: Right arm  Patient Position: Sitting  Pulse: 94    Wt Readings from Last 3 Encounters:  10/24/23 105 kg (231 lb 7.7 oz)  09/27/23 107 kg (235 lb 4 oz)  09/24/23 107 kg (236 lb)     Physical Exam: General appearance: alert, appears stated age, cooperative, and no distress Head: normocephalic,  atraumatic Eyes: Perrla, EOMI Lungs: Ctab Heart: S1S2 present,regular rate and rhythm, no murmurs Abdomen: is flat,soft, no tenderness, bs present Extremities: extremities normal, atraumatic, no cyanosis or edema Skin: Skin color, texture, turgor normal. No rashes or lesions Neurologic: alert and oriented times 3,CN I-XII intact, no neurologic deficits   Results for orders placed or performed in visit on 10/27/23  Vitamin D, 25-Hydroxy   Collection Time: 10/27/23 10:14 AM  Result Value Ref Range   Vitamin D 25-Hydroxy 61.7 30.0 - 100.0 ng/mL  Uric Acid   Collection Time: 10/27/23 10:14 AM  Result Value Ref Range   Uric Acid 16.8 (H) 2.3 - 6.6 mg/dL  Renal Function Panel   Collection Time: 10/27/23 10:14 AM  Result Value Ref Range   Sodium 136 136 - 145 mmol/L   Potassium 3.5 3.5 - 5.1 mmol/L   Chloride 95 (L) 98 - 107 mmol/L   CO2 27 21 - 31 mmol/L   Anion Gap 14 6 - 14 mmol/L   Glucose, Random 253 (H) 70 - 99 mg/dL   Blood Urea Nitrogen (BUN) 71 (H) 7 - 25 mg/dL   Creatinine 7.97 (H) 9.39 -  1.20 mg/dL   eGFR 27 (L) >40 fO/fpw/8.26f7   Albumin 3.5 3.5 - 5.7 g/dL   Calcium  8.9 8.6 - 10.3 mg/dL   Phosphorus 3.1 2.5 - 5.0 mg/dL   BUN/Creatinine Ratio 35.1 (H) 10.0 - 20.0  Parathyroid Hormone (PTH), Intact   Collection Time: 10/27/23 10:14 AM  Result Value Ref Range   Parathyroid Hormone (PTH), Intact 144 (H) 12 - 88 pg/mL  CBC with Differential   Collection Time: 10/27/23 10:14 AM  Result Value Ref Range   WBC 3.90 (L) 4.40 - 11.00 10*3/uL   RBC 4.04 (L) 4.10 - 5.10 10*6/uL   Hemoglobin 10.8 (L) 12.3 - 15.3 g/dL   Hematocrit 67.4 (L) 64.0 - 44.6 %   Mean Corpuscular Volume (MCV) 80.5 80.0 - 96.0 fL   Mean Corpuscular Hemoglobin (MCH) 26.6 (L) 27.5 - 33.2 pg   Mean Corpuscular Hemoglobin Conc (MCHC) 33.1 33.0 - 37.0 g/dL   Red Cell Distribution Width (RDW) 17.1 (H) 12.3 - 17.0 %   Platelet Count (PLT) 287 150 - 450 10*3/uL   Mean Platelet Volume (MPV) 8.6 6.8 - 10.2 fL   Neutrophils % 55 %   Lymphocytes % 24 %   Monocytes % 17 %   Eosinophils % 3 %   Basophils % 1 %   nRBC % 0 %   Neutrophils Absolute 2.10 1.80 - 7.80 10*3/uL   Lymphocytes # 0.90 (L) 1.00 - 4.80 10*3/uL   Monocytes # 0.70 0.00 - 0.80 10*3/uL   Eosinophils # 0.10 0.00 - 0.50 10*3/uL   Basophils # 0.00 0.00 - 0.20 10*3/uL   nRBC Absolute 0.00 <=0.00 10*3/uL       Assessment:   1. Type 2 diabetes mellitus with stage 3b chronic kidney disease, with long-term current use of insulin     (CMD)   2. 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 Benign hypertension with CKD (chronic kidney disease) stage III (CMD)   4. Cardiorenal syndrome with renal failure   5. Metabolic bone disease   6. Anemia due to stage 3b chronic kidney disease (CMD)   7. Vitamin D deficiency   8. Hypokalemia      Plan:  - Increased BUN/creatinine ratio, fluid weight loss consistent with slight overdiuresis using dual diuretic therapy.  - Recommend continuing on  torsemide 40 mg twice daily. - Decrease  metolazone to 2.5mg  3 times weekly. - Encourage dietary potassium intake. - Agree with holding spironolactone , hydralazine , losartan  for now. - Blood pressure well-controlled. - Strict diabetes management. -Phos, bicarb, PTH, vitamin D, hemoglobin at goal for CKD stage. - Sent referrals for allergy to evaluate for candidacy for desensitization as well as rheumatology for consideration of more advanced therapies for gout treatment. - Return to clinic in 3 months.   Orders Placed This Encounter  Medications  . metOLazone (ZAROXOLYN) 2.5 mg tablet    Sig: Take 1 tablet (2.5 mg total) by mouth 3 (three) times a week. Take 30-60 mins before the torsemide dose    Dispense:  36 tablet    Refill:  6   Orders Placed This Encounter  Procedures  . Vitamin D, 25-Hydroxy    Standing Status:   Future    Expected Date:   01/21/2024    Expiration Date:   10/31/2024    Release to patient::   Immediate  . Renal Function Panel    Standing Status:   Future    Expected Date:   01/21/2024    Expiration Date:   10/31/2024    Release to patient::   Immediate  . Parathyroid Hormone (PTH), Intact    Standing Status:   Future    Expected Date:   01/21/2024    Expiration Date:   10/31/2024    Release to patient::   Immediate  . Uric Acid    Standing Status:   Future    Expected Date:   01/21/2024    Expiration Date:   05/03/2024    Release to patient::   Immediate  . CBC with Differential    Standing Status:   Future    Expected Date:   01/21/2024    Expiration Date:   10/31/2024    Release to patient::   Immediate  . Urinalysis with Microscopic    Standing Status:   Future    Expected Date:   01/21/2024    Expiration Date:   10/31/2024    Release to patient::   Immediate  . Albumin, Random Urine    Standing Status:   Future    Expected Date:   01/22/2024    Expiration Date:   10/31/2024    Release to patient::   Immediate  . Ambulatory referral to Allergy    Standing Status:   Future    Expiration  Date:   12/01/2024    Referral Priority:   Routine    Referral Type:   PROVIDER REFERRAL    Number of Visits Requested:   1  . Ambulatory referral to Rheumatology    Standing Status:   Future    Expiration Date:   12/01/2024    Referral Priority:   Routine    Referral Type:   PROVIDER REFERRAL    Requested Specialty:   Rheumatology    Number of Visits Requested:   1    ADETOYE  LUFADEJU, MD, FASN 11/01/2023 12:33 PM

## 2023-11-02 ENCOUNTER — Ambulatory Visit: Payer: Self-pay

## 2023-11-02 ENCOUNTER — Encounter: Payer: Self-pay | Admitting: Family Medicine

## 2023-11-02 NOTE — Telephone Encounter (Signed)
 FYI Only or Action Required?: FYI only for provider.  Patient was last seen in primary care on 10/29/2023 by Tonya Bleacher, MD.  Called Nurse Triage reporting Hyperglycemia.  Symptoms began several days ago. Since coming home from the hospital  Interventions attempted: Prescription medications: insulin , Rest, hydration, or home remedies, and Other: following her diet.  Symptoms are: unchanged.  Triage Disposition: Home Care  Patient/caregiver understands and will follow disposition?:   Copied from CRM [id number]. Topic: Clinical - Red Word Triage >> Nov 02, 2023 11:54 AM Jasmin G wrote: [facility] that prompted transfer to Nurse Triage: Nurse got in contact, pt. been out of hospital and sugar reading been over 200 Reason for Disposition  [1] Blood glucose 240 - 300 mg/dL (13.3 - 16.7 mmol/L) AND [2] uses insulin  (e.g., insulin -dependent, all people with type 1 diabetes)  Answer Assessment - Initial Assessment Questions 1. BLOOD GLUCOSE: What is your blood glucose level?      Blood sugars are running over 200  2. ONSET: When did you check the blood glucose?     Check in the morning 3. USUAL RANGE: What is your glucose level usually? (e.g., usual fasting morning value, usual evening value)     Prior to being in the hospital, normal blood sugars have been below 150 4. KETONES: Do you check for ketones (urine or blood test strips)? If Yes, ask: What does the test show now?      N/A 5. TYPE 1 or 2:  Do you know what type of diabetes you have?  (e.g., Type 1, Type 2, Gestational; doesn't know)      Type 2  6. INSULIN : Do you take insulin ? What type of insulin (s) do you use? What is the mode of delivery? (syringe, pen; injection or pump)?      Sliding scale insulin , monjaro, lantus  7. DIABETES PILLS: Do you take any pills for your diabetes? If Yes, ask: Have you missed taking any pills recently?     N/A 8. OTHER SYMPTOMS: Do you have any symptoms? (e.g., fever, frequent  urination, difficulty breathing, dizziness, weakness, vomiting)     no  Protocols used: Diabetes - High Blood Sugar-A-AH

## 2023-11-02 NOTE — Telephone Encounter (Signed)
 FYI

## 2023-11-04 ENCOUNTER — Telehealth: Payer: Self-pay | Admitting: Family Medicine

## 2023-11-04 NOTE — Telephone Encounter (Signed)
 A document form from [facility] has been faxed: Home Health Certificate (Order ID [id number]), to be filled out by provider. Send document back via Fax within 7-days. Document is located in providers tray at front office.          Fax number: [phone]

## 2023-11-04 NOTE — Telephone Encounter (Signed)
 Noted. Form placed in Dr. Luigi folder for signature

## 2023-11-08 ENCOUNTER — Other Ambulatory Visit: Payer: Self-pay | Admitting: Family Medicine

## 2023-11-08 DIAGNOSIS — N644 Mastodynia: Secondary | ICD-10-CM

## 2023-11-08 DIAGNOSIS — N6320 Unspecified lump in the left breast, unspecified quadrant: Secondary | ICD-10-CM

## 2023-11-08 NOTE — Telephone Encounter (Signed)
 Faxed to [facility] at number requested.

## 2023-11-15 ENCOUNTER — Encounter: Payer: Self-pay | Admitting: Family

## 2023-11-15 ENCOUNTER — Other Ambulatory Visit: Payer: Self-pay | Admitting: Family Medicine

## 2023-11-15 MED ORDER — SULFAMETHOXAZOLE-TRIMETHOPRIM 800-160 MG PO TABS: 1.0000 | ORAL_TABLET | Freq: Two times a day (BID) | ORAL | 0 refills | Status: DC

## 2023-11-16 ENCOUNTER — Other Ambulatory Visit: Payer: Self-pay | Admitting: Family Medicine

## 2023-11-16 ENCOUNTER — Ambulatory Visit: Payer: Self-pay

## 2023-11-16 NOTE — Telephone Encounter (Signed)
 FYI Only or Action Required?: FYI only for provider.  Patient was last seen in primary care on 10/29/2023 by Tanda Bleacher, MD.  Called Nurse Triage reporting Fall.  Symptoms began yesterday.  Symptoms are: stable.  Triage Disposition: See PCP When Office is Open (Within 3 Days)  Patient/caregiver understands and will follow disposition?: No, refuses disposition     [facility] calling to inform that Tonya Evans had a fall on yesterday , she's having trouble sleeping and her blood sugar was 300      Reason for Disposition  [1] Taking Coumadin  (warfarin) or other strong blood thinner AND [2] falling is a recurrent problem  Answer Assessment - Initial Assessment Questions [facility] also mentioned her blood sugar of 300 but patient denies any symptoms. Patient did not want to be seen in the office for her fall or blood sugar.      1. MECHANISM: How did the fall happen?     I just fell 2. DOMESTIC VIOLENCE AND ELDER ABUSE SCREENING: Did you fall because someone pushed you or tried to hurt you? If Yes, ask: Are you safe now?     No 3. ONSET: When did the fall happen? (e.g., minutes, hours, or days ago)     Yesterday  4. LOCATION: What part of the body hit the ground? (e.g., back, buttocks, head, hips, knees, hands, head, stomach)     N/A 5. INJURY: Did you hurt (injure) yourself when you fell? If Yes, ask: What did you injure? Tell me more about this? (e.g., body area; type of injury; pain severity)     No injuries reported  6. PAIN: Is there any pain? If Yes, ask: How bad is the pain? (e.g., Scale 0-10; or none, mild,      No 7. SIZE: For cuts, bruises, or swelling, ask: How large is it? (e.g., inches or centimeters)      N/A 9. OTHER SYMPTOMS: Do you have any other symptoms? (e.g., dizziness, fever, weakness; new-onset or worsening).      No 10. CAUSE: What do you think caused the fall (or falling)? (e.g., dizzy spell, tripped)        I just fell  Protocols used: Falls and [facility]

## 2023-11-17 ENCOUNTER — Ambulatory Visit
Admission: RE | Admit: 2023-11-17 | Discharge: 2023-11-17 | Disposition: A | Discharge status: Home / Self Care | Source: Ambulatory Visit | Attending: Family Medicine | Admitting: Family Medicine

## 2023-11-17 ENCOUNTER — Other Ambulatory Visit: Payer: Self-pay | Admitting: Family Medicine

## 2023-11-17 DIAGNOSIS — N644 Mastodynia: Secondary | ICD-10-CM

## 2023-11-17 DIAGNOSIS — N6320 Unspecified lump in the left breast, unspecified quadrant: Secondary | ICD-10-CM

## 2023-11-22 ENCOUNTER — Telehealth: Payer: Self-pay | Admitting: Family Medicine

## 2023-11-22 NOTE — Consults (Signed)
 [facility]  NEPHROLOGY CONSULT NOTE    Tonya Evans is being seen in consultation at the request of Norleen Rollene Schaumann, MD for evaluation of AKI, CKD 3/4.    HPI  Tonya Evans is a 68 y.o. 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 Was recently seen at office visit 11/01/2023, BUN/creatinine 71/2.0 in the recovery phase of recent AKI. Has had multiple hospital admissions for HFrEF exacerbation, required dobutamine drip, Lasix  drip and metolazone and readmitted in May for 2 weeks with similar presentation. She was discharged on torsemide 40 mg twice daily and daily metolazone.  Her metolazone dose was decreased at office visit to 3 times weekly due to concern for overdiuresis. Admitted yesterday with complaints of decreased p.o. intake for the past week, fatigue, malaise and altered mental status. Emergency room labs showed significant worsening BUN/creatinine- 94/3.5, lactic acidosis 3.0, proBNP 1466, hemoglobin 10.6 with associated lymphopenia. Urinalysis negative for blood, protein. Of note patient was prescribed Bactrim  for treatment of urinary tract infection prior to presentation. Was given saline fluid bolus, not on maintenance infusion. Symptoms are somewhat improved today.   Medical History: Medical History[1]  Surgical History: Surgical History[2]  Allergies: Aspartame, Adhesive tape-silicones, Allopurinol , Lisinopril, Meclizine, Metformin, Peanut, Penicillin, Tree nut, and Aspirin   Medications:   Current Medications[3]     Social History:  Social History[4]   Family History: Family History[5]   Review of Systems: Review of Systems -complete review of systems conducted otherwise negative except as above.  Objective  Vital Signs  last 24 hours: Temp:  [97.5 F (36.4 C)-97.9 F (36.6 C)] 97.9 F (36.6 C) Heart Rate:  [96-102] 100 Resp:  [16-20] 18 BP: (96-140)/(49-85) 112/49 108 kg (238 lb 12.8 oz)  Physical Exam: General appearance -elderly female, obese, not in acute distress. Mouth - mucous membranes moist Chest - clear to auscultation, no wheezes, rales or rhonchi, symmetric air entry Heart -s1s2 present normal rate and regular rhythm Abdomen -obese,soft, nontender, bs present. Musculoskeletal - no joint tenderness, deformity or swelling Extremities -1+ bipedal edema present. Skin - normal coloration, no rashes, no suspicious skin lesions noted Neurological - alert, oriented, normal speech, no focal findings   Labs:   Lab Results  Component Value Date   WBC 3.41 (L) 11/22/2023   HGB 10.9 (L) 11/22/2023   HCT 33.2 (L) 11/22/2023   PLT 165 11/22/2023    Lab Results  Component Value Date   NA 130 (L) 11/22/2023   K 5.0 (H) 11/22/2023   CL 95 (L) 11/22/2023   CO2 23 11/22/2023   BUN 97 (H) 11/22/2023   CREATININE 3.54 (H) 11/22/2023   CALCIUM  9.6 11/22/2023   MG 2.4 11/22/2023   PHOS 5.6 (H) 11/22/2023    Lab Results  Component Value Date   BILITOT 1.1 (H) 11/21/2023   BILIDIR 0.7 (H) 08/10/2023   PROT 6.9 11/21/2023   ALBUMIN 3.5 11/21/2023   ALT 29 11/21/2023   AST 39 11/21/2023    Lab Results  Component Value Date   INR 2.6 (H) 08/10/2023      Imaging: Radiology Results (last 72 hours)     Procedure Component Value Units Date/Time   US  Renal Bilateral Complete [[id number]] Collected: 11/21/23 2237   Order Status: Completed Updated: 11/21/23 2241   Narrative:  CLINICAL DATA:  Acute kidney injury.  EXAM: RENAL / URINARY TRACT ULTRASOUND COMPLETE  COMPARISON:  July 21, 2023  FINDINGS: Right Kidney:  Renal measurements: 12.2 cm x 4.5 cm x 4.7 cm = volume: 135.6 mL. Echogenicity within normal limits. No mass or hydronephrosis visualized.  Left Kidney:  Renal  measurements: 10.1 cm x 5.8 cm x 4.7 cm = volume: 144.1 mL. Echogenicity within normal limits. No mass or hydronephrosis visualized.  Bladder:  Appears normal for degree of bladder distention.  Other:  None.    Impression:     Unremarkable renal ultrasound.   Electronically Signed   By: Suzen Dials M.D.   On: 11/21/2023 22:39    CT Chest Abdomen Pelvis WO Contrast [[id number]] Collected: 11/21/23 1635   Order Status: Completed Updated: 11/21/23 1655   Narrative:     CLINICAL DATA:  Pulmonary edema versus pneumonia. Abdominal pain and tachycardia. Elevated lactate.  EXAM: CT CHEST, ABDOMEN AND PELVIS WITHOUT CONTRAST  TECHNIQUE: Multidetector CT imaging of the chest, abdomen and pelvis was performed following the standard protocol without IV contrast.  RADIATION DOSE REDUCTION: This exam was performed according to the departmental dose-optimization program which includes automated exposure control, adjustment of the mA and/or kV according to patient size and/or use of iterative reconstruction technique.  COMPARISON:  07/16/2023, chest CT 09/16/2022  FINDINGS: CT CHEST FINDINGS  Cardiovascular: Left-sided cardiac pacemaker is present. There is stable moderate cardiomegaly. Calcified plaque over the left main and 3 vessel coronary arteries. Thoracic aorta is normal caliber. There is calcified plaque over the descending thoracic aorta. Remaining vascular structures are unremarkable.  Mediastinum/Nodes: No significant mediastinal or hilar adenopathy. Remaining mediastinal structures are unremarkable.  Lungs/Pleura: Lungs are adequately inflated without acute airspace process or effusion. Minimal scarring/atelectasis right base. Stable 7 mm nodule over the right lower lobe just above the diaphragm. Airways are normal.  Musculoskeletal: No focal abnormality.  CT ABDOMEN PELVIS FINDINGS  Hepatobiliary: Liver and biliary tree are normal.  Mild cholelithiasis.  Pancreas: Normal.  Spleen: Normal.  Adrenals/Urinary Tract: Adrenal glands are normal. Kidneys are normal size without hydronephrosis or nephrolithiasis. Stable cyst over the left upper pole. 8 mm hyperdense nodule over the medial right midpole likely hyperdense cyst and somewhat difficult to visualize on the prior exam. Ureters and bladder are normal.  Stomach/Bowel: Stomach and small bowel are normal. Appendix not visualized. Colon is unremarkable.  Vascular/Lymphatic: Mild calcified plaque over the abdominal aorta which is normal in caliber. Calcified plaque over the celiac axis and superior mesenteric artery. No adenopathy.  Reproductive: Uterus and bilateral adnexa are unremarkable.  Other: No free fluid or focal inflammatory change. Evidence of previous abdominal wall hernia repair with diastasis of the rectus abdominus muscles unchanged.  Musculoskeletal: Mild grade 1 anterolisthesis of L5 on S1 with bilateral L5 spondylolysis unchanged.    Impression:     1. No acute findings in the chest, abdomen or pelvis. 2. Stable moderate cardiomegaly. Atherosclerotic coronary artery disease. 3. Mild cholelithiasis. 4. Stable left renal cyst. 8 mm hyperdense nodule over the medial right midpole likely hyperdense cyst. 5. Aortic atherosclerosis. 6. Stable grade 1 anterolisthesis of L5 on S1 with bilateral L5 spondylolysis. 7. Stable 7 mm nodule right lower lobe. Recommend follow-up chest CT 1 year to document 2 years of stability. This recommendation follows the consensus statement: Guidelines for Management of Incidental Pulmonary Nodules Detected on CT Images: From the Fleischner Society 2017; Radiology 2017; 284:228-243.  Aortic Atherosclerosis (ICD10-I70.0).   Electronically Signed  By: Toribio Agreste M.D.   On: 11/21/2023 16:53    CT Head WO Contrast [[id number]] Collected: 11/21/23 1403   Order Status: Completed Updated: 11/21/23 1410    Narrative:     CLINICAL DATA:  Altered mental status and difficulty sleeping.  EXAM: CT HEAD WITHOUT CONTRAST  TECHNIQUE: Contiguous axial images were obtained from the base of the skull through the vertex without intravenous contrast.  RADIATION DOSE REDUCTION: This exam was performed according to the departmental dose-optimization program which includes automated exposure control, adjustment of the mA and/or kV according to patient size and/or use of iterative reconstruction technique.  COMPARISON:  10/23/2023  FINDINGS: Brain: Ventricles, cisterns and other CSF spaces are normal. Stable old infarct versus chronic ischemic change over the right parietal region. No mass, mass effect, shift of midline structures or acute hemorrhage. No evidence of acute infarction.  Vascular: No hyperdense vessel or unexpected calcification.  Skull: Normal. Negative for fracture or focal lesion.  Sinuses/Orbits: Orbits are normal.  Paranasal sinuses are clear.  Other: None.    Impression:     1. No acute findings. 2. 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   Electronically Signed   By: Toribio Agreste M.D.   On: 11/21/2023 14:08    US  Peripheral Venous Leg Unilat Left [[id number]] Collected: 11/21/23 1355   Order Status: Completed Updated: 11/21/23 1358   Narrative:     EXAM: ULTRASOUND DUPLEX OF THE LEFT LOWER EXTREMITY VEINS  TECHNIQUE: Duplex ultrasound using B-mode/gray scaled imaging and Doppler spectral analysis and color flow was obtained of the deep venous structures of the left lower extremity.  COMPARISON: None.  CLINICAL HISTORY: Pain and swelling.  FINDINGS: The visualized veins of the lower extremity are patent and free of echogenic thrombus. The veins demonstrate good compressibility with normal color flow study and spectral analysis.    Impression:     1. No evidence of DVT.  Electronically signed  by: evalene coho 11/21/2023 01:56 PM EDT RP Workstation: [id number]    XR Chest 2 Views [[id number]] Collected: 11/21/23 1339   Order Status: Completed Updated: 11/21/23 1344   Narrative:     EXAM: 2 VIEW(S) XRAY OF THE CHEST 11/21/2023 01:33:38 PM  COMPARISON: 10/23/2023  CLINICAL HISTORY: Cough. Patient states I feel like I can't stay awake but then states she isn't sleeping at night either. Patient states she has been this way since leaving the hospital early July. This RN tried to ask patient more questions and her response as my daughter will be able to you how I've been acting.  FINDINGS:  LUNGS AND PLEURA: Lateral projection radiograph is significantly diminished in detail due to patient body habitus. Lung volumes are low. Asymmetric increased opacity within the left lower lung may reflect artifact from portable technique and increased body habitus. Underlying pleural effusion or airspace disease not excluded.  HEART AND MEDIASTINUM: Stable cardiac enlargement. Aortic atherosclerosis.  BONES AND SOFT TISSUES: Left chest wall ICD noted with leads in the right atrial appendage and right ventricle.    Impression:     1. Asymmetric increased opacity within the left base, possibly artifact. Underlying pleural effusion or airspace disease not excluded. 2. Stable cardiac enlargement and aortic atherosclerosis.  Electronically signed by: Waddell Calk MD 11/21/2023 01:42 PM EDT RP Workstation: [id number]         Assessment: 1) Oliguric AKI likely due to Bactrim  induced nephrotoxicity in the setting of dual diuretic therapy, Farxiga  causing drug  interaction, prerenal azotemia with FeUrea 25.6%. 2) CKD stage IIIb/early stage IV, recurrent AKI's, diabetes, hypertension, CHF-last at baseline June, 2025 with creatinine range 1.6-1.8. 3)HFrEF 15 to 20% with recurrent exacerbations, pulmonary hypertension. 4)Chronic hyponatremia, borderline hyperkalemia. 5)Type 2  diabetes with elevated blood glucose. 6 )Hyperphosphatemia/metabolic bone disease time good   Plan: -Strict I's and O's, monitor urine output. -Encourage p.o. fluid intake. - Diuretics, SGLT2 inhibitor on hold. - Weight up about 8 pounds from last office visit. - Continue to monitor for renal recovery - Avoid nephrotoxins.   Nutrition Renal diet is 2gm K, 2 gm Na, 1gm Phos when getting oral/enteral feeds. Renally dose all meds for GFR/CrCl < 60ml/min in Acute kidney injury and avoid nephrotoxic agents. No phosphasoda enemas or magnesium containing products(milk of magnesia).  No IVs or PICC lines in arms (except hands). No BP checks or blood draws on arms with AV fistulas or grafts.   Howell King MD, FASN 11/22/2023 2:27 PM          [1] Past Medical History: Diagnosis Date  . A-fib    (CMD)   . Asthma (CMD)   . CAD in native artery 03/07/2020  . Diabetes mellitus    (CMD)   . DVT (deep venous thrombosis)    (CMD)   . Hyperlipidemia   . Hypertension   [2] Past Surgical History: Procedure Laterality Date  . CARDIAC DEFIBRILLATOR PLACEMENT     Procedure: CARDIAC DEFIBRILLATOR PLACEMENT  . CARDIAC ELECTROPHYSIOLOGY PROCEDURE N/A 04/22/2023   CV CHANGE GENERATOR IMPLANTABLE CARDIOVERTER DEFIBRILLATOR DUAL CHAMBER performed by Hildegard Rinks, MD at [facility]  . CESAREAN SECTION, UNSPECIFIED     Procedure: CESAREAN SECTION; x3  . CORONARY ANGIOPLASTY WITH STENT PLACEMENT     Procedure: CORONARY ANGIOPLASTY WITH STENT PLACEMENT  . DILATION AND CURETTAGE, DIAGNOSTIC / THERAPEUTIC     Procedure: DILATION AND CURETTAGE, DIAGNOSTIC / THERAPEUTIC  . HERNIA REPAIR     Procedure: HERNIA REPAIR  . OTHER SURGICAL HISTORY Left 02/27/2022   Procedure: OTHER SURGICAL HISTORY (Excision of mass of left upper extremity); Dr.Lininiger;Surgeon  . TONSILLECTOMY     Procedure: TONSILLECTOMY  [3]  Current Facility-Administered Medications:  .  acetaminophen (TYLENOL) tablet 500 mg, 500  mg, oral, Daily PRN, Danielle Kristine Quance, MD .  albuterol sulfate 2.5 mg/0.5 mL nebulizer solution 2.5 mg, 2.5 mg, nebulization, Q6H PRN, Danielle Kristine Quance, MD, 2.5 mg at 11/22/23 1348 .  apixaban  (ELIQUIS ) tablet 5 mg, 5 mg, oral, BID, Danielle Kristine Quance, MD, 5 mg at 11/22/23 0948 .  aspirin chewable tablet 81 mg, 81 mg, oral, Daily, Danielle Kristine Quance, MD, 81 mg at 11/22/23 0948 .  atorvastatin  (LIPITOR) tablet 20 mg, 20 mg, oral, At Bedtime, Danielle Kristine Quance, MD, 20 mg at 11/21/23 2158 .  budesonide  (PULMICORT ) 0.5 mg/2 mL nebulizer solution 0.5 mg, 0.5 mg, nebulization, RDaily, Danielle Kristine Quance, MD, 0.5 mg at 11/21/23 1943 .  cholecalciferol (VITAMIN D3) 1,000 unit (25 mcg) tablet/capsule 1,000 Units, 1,000 Units, oral, Daily, Danielle Kristine Quance, MD, 1,000 Units at 11/22/23 [phone] .  [Held by provider] clonazePAM  (KlonoPIN ) tablet 0.5 mg, 0.5 mg, oral, At Bedtime PRN, Danielle Kristine Quance, MD .  Roni by provider] cyclobenzaprine  (FLEXERIL ) tablet 5 mg, 5 mg, oral, BID PRN, Danielle Kristine Quance, MD .  dextrose (D50W) 50 % injection 12.5 g, 12.5 g, intravenous, PRN, Danielle Kristine Quance, MD .  dextrose (GLUTOSE) 40 % oral gel 15 g, 15 g, oral, PRN, Danielle Kristine Quance, MD .  ezetimibe  (ZETIA ) tablet 10 mg, 10 mg, oral, Daily, Danielle Kristine Quance, MD, 10 mg at 11/22/23 0948 .  [Held by provider] gabapentin  (NEURONTIN ) capsule 300 mg, 300 mg, oral, Daily, Danielle Kristine Quance, MD .  insulin  glargine (LANTUS ) injection 10 Units, 10 Units, subcutaneous, QAM, Danielle Kristine Quance, MD, 10 Units at 11/22/23 0947 .  insulin  lispro (HumaLOG) injection 0-12 Units, 0-12 Units, subcutaneous, TID PC, Danielle Kristine Quance, MD, 2 Units at 11/22/23 1301 .  loratadine (CLARITIN) tablet 10 mg, 10 mg, oral, BID, Danielle Kristine Quance, MD, 10 mg at 11/22/23 0948 .  [Held by provider] metOLazone (ZAROXOLYN) tablet 2.5 mg, 2.5 mg, oral,  Daily, Danielle Kristine Quance, MD .  omeprazole  (PriLOSEC) DR capsule 40 mg, 40 mg, oral, Daily, Danielle Kristine Quance, MD, 40 mg at 11/22/23 0948 .  ondansetron  (ZOFRAN ) injection 4 mg, 4 mg, intravenous, Q8H PRN, Danielle Kristine Quance, MD .  Roni by provider] torsemide [facility]) tablet 40 mg, 40 mg, oral, BID - Transplant, Danielle Kristine Quance, MD [4] Social History Socioeconomic History  . Marital status: Divorced  Tobacco Use  . Smoking status: Former    Current packs/day: 0.00    Types: Cigarettes    Quit date: 1979    Years since quitting: 46.6  . Smokeless tobacco: Never  Vaping Use  . Vaping status: Never Used  Substance and Sexual Activity  . Alcohol use: No  . Drug use: No  . Sexual activity: Not Currently    Partners: Male   Social Drivers of Health   Food Insecurity: No Food Insecurity (10/28/2023)   Received from [facility]   Food vital sign   . Within the past 12 months, you worried that your food would run out before you got money to buy more: Never true   . Within the past 12 months, the food you bought just didn't last and you didn't have money to get more: Never true  Transportation Needs: No Transportation Needs (10/28/2023)   Received from [facility] - Transportation   . In the past 12 months, has lack of transportation kept you from medical appointments or from getting medications?: No   . In the past 12 months, has lack of transportation kept you from meetings, work, or from getting things needed for daily living?: No  Safety: Patient Unable To Answer (10/28/2023)   Received from [facility]   Safety   . Within the last year, have you been afraid of your partner or ex-partner?: Patient unable to answer   . Within the last year, have you been humiliated or emotionally abused in other ways by your partner or ex-partner?: Patient unable to answer   . Within the last year, have you been kicked, hit, slapped, or otherwise physically hurt  by your partner or ex-partner?: Patient unable to answer   . Within the last year, have you been raped or forced to have any kind of sexual activity by your partner or ex-partner?: Patient unable to answer  Living Situation: Unknown (10/28/2023)   Received from [facility] Sit[facility]   . In the last 12 months, was there a time when you were not able to pay the mortgage or rent on time?: No   . At any time in the past 12 months, were you homeless or living in a shelter (including now)?: No  [5] Family History Problem Relation Name Age of Onset  . Hypertension Mother    . Heart attack Mother    .  Diabetes Father    . Stroke Father    . Hypertension Father    . Kidney failure Father         DIALYSIS  . Diabetes Sister    . Hypertension Sister    . Aneurysm Sister    . Stroke Sister    . Kidney failure Brother         DIALYSIS  . Diabetes Brother    . Hypertension Brother    . Heart failure Brother    . Lupus Neg Hx    . Rheum arthritis Neg Hx    . Ankylosing spondylitis Neg Hx    . Psoriasis Neg Hx    . Gout Neg Hx

## 2023-11-22 NOTE — Progress Notes (Signed)
 ------------------------------------------------------------------------------- Attestation signed by Norleen Rollene Schaumann, MD at 11/22/2023  1:27 PM I saw and evaluated the patient. Discussed with resident and agree with resident's findings and plan as documented in the resident's note.     Norleen Rollene Schaumann, MD    Tonya Evans is a 68yoF pmhx  paroxysmal A-fib (on Eliquis ), hypertension, T2DM, hyperlipidemia, HFrEF (EF 15-20%), CAD, and CKD stage 3b who presents for 1 week history of fatigue.   #AKI on CKD #HFrEF #AMS Pt presenting with reports of altered mental status and fatigue over the last several days to a week. On evaluation she has an AKI on CKD concerning for cardiorenal versus pre-renal. As she weighed 50 pounds less than July and has had metolazone prescribed both tid by nephrology and daily by cardiology, favoring pre-renal AKI which is driving azotemia and possibly uremic encephalopathy. Though BNP elevated, she has had fluctuations and previous presentations for exacerbation ~3 times higher thus this may be baseline for her? Likely another element of recent bactrim  use (competitive clearance of Cr) at play.  Overnight, she had no meaningful change in her renal function with gentle fluids. At this point, I would have expected fluids to help a hypovolemia etiology or exacerbate a cardiorenal etiology. Given stability, Bactrim  either competitive clearance or progression of CKD raises in the differential. Renal ultrasound without obstruction. Will consult nephrology to evaluate and offer guidance on both etiology and next steps.   #Lactic acidosis, resolved  Throughout the evening of admission her lactic acid was mildly elevated and trended. Overall, given her bicarb was wnl and stable the presence of lactic acidosis was attributed to a mildly low flow state related to her heart failure though not in acute decline or shock as her blood pressure remains reasonable. It was then compounded  by reduced clearance with her AKI on CKD. Lactic acid this morning resolved.  #HFrEF 15-20% #Pulmonary hypertension, class 2 Profound HF history with multiple prior admissions for exacerbation. At this admission, above volume exam suggestive of hypovolemia. No oxygen requirement or evidence of pulmonary edema on CT. Severe pulmonary hypertension class 2 secondary to HF will likely make her highly tenuous from a volume standpoint. Given significant heart failure likely contributing an element of lower flow (though no evidence in overt cardiogenic shock at this time), will consult advanced heart failure for additional guidance.   Disposition pending improvement in renal function. -------------------------------------------------------------------------------  [facility] [place] [facility] Progress Note  ASSESSMENT/PLAN: Tonya Evans is a 68 y.o. female with  paroxysmal A-fib (AC with Eliquis ), hypertension, T2DM, hyperlipidemia, HFrEF, CAD, CKD  who presented with altered mental status and fatigue secondary to hypovolemia and AKI due to over-diuresis.      #Altered Mental Status, poa, active #Acute Kidney Injury, poa, active #CKD III/IV #Uremia -- With sCr of 3.31 from baseline of 1.7.  BUN 94 from baseline 50s. no additional electrolyte abnormalities at this time. Patient difficult to determine volume status, but per history has been malasie and fatigue with decreased PO intake per daughter.  Also with conflicting doses of metolazone per nephrology and cardiology notes (nephrology recently changed to 3x weekly after taking daily per cardiology). Patient states she is taking metolazone 3 times weekly. Holding diuretics in setting of AKI, gave 1L fluids. FeUrea to suggest intrinsic cause of AKI. However, sCr did not improve so involving specialists to help with management.  -- Presenting with confusion over past week, forgetting names with acute worsening of confusion and lack of focus. suspect  there is a component of uremic encephalopathy in addition to generalized malaise/fatigue associated with dehydration/hypovolemia. Seems to have improved. Will continue to monitor closely with treatment of AKI.  --Also with prolonged course of Bactrim  prior to presentation, could be appearing as worsened AKI due to competitive clearance. Suspect that stable AKI could be component of bactrim  toxicity vs clearance with subacute worsening of already late stage CKD.  Plan  - Outpatient nephrologist Dr. Howell, will consult nephrology since worsening AKI with fluids - Daily CBC, BMP, Mg - Gentle fluids: 500 cc bolus x2 - Renal US  without obstruction - Holding home medications that may worsen confusion: Klonopin , Flexeril , gabapentin  - Continue vitamin D   #Lactic Acidosis, improving -- Lactate on presentation 2.62, has been stable with trending lactic acid 3.0 to 2.7. Patient is overall well appearing. Very low suspicion for acute ischemic process including shock as patient has been normotensive. Bowel ischemia unlikely based on CT abdomen. No evidence of infection or SIRS criteria to suggest component of sepsis. No evidence of PE or pericardial effusion noted on CT scan, although without contrast. Likely secondary to chronically low perfusing state secondary to her very severe heart failure, however low threshold to transfer to CCU for inotropic support if she were to clinically decline. Will work with above consultants to correct AKI which should help clearance of lactic acid as it is renally cleared.  Plan:  - continue to monitor, afternoon lactic acid to trend to normal    #Heart Failure with Reduced EF (15-20%), poa, active #Pulmonary Hypertension Presenting with co commitant elevation in BNP to 1466 in addition to acute kidney injury with sCr of 3.31 from baseline of 1.7. Echocardiogram from 07/2023 showing LV ejection fraction 15-20% with severe pulmonary hypertension and severe dilation and  hypokinesis of the right ventricle.  Plan  - Heart Failure team to see and evaluate, appreciate recommendations - Continuous pulse ox - Hold metolazone/torsemide in setting of aki, will resume when clinically appropriate - Continue Zetia  - Continue Lipitor - Continue home inhalers     #LLE swelling, poa, active Presenting with left lower extremity swelling and tenderness to level of the knee.  DVT ultrasound in the ED negative for acute DVT.  Patient already taking Eliquis  for A-fib, so no need to empirically anticoagulate.  Will monitor for now.  Unclear if this is edema versus lymphedema Plan  - prn Tylenol for pain - CTM    #Nausea With mild abdominal pain on presentation, lipase normal. Presented with subjective malaise and nausea has improved with anti-emetics.   PLAN:  - prn zofran    Chronic medical conditions #T2DM :: lantus  10, sliding scale #Atrial fibrillation:: continue eliquis , digoxin    Hospital Checklist: #DVT PPX: Fully Anticoagulated #FEN: Adult Diet- Renal; CKD (pro/K/Phos/Na restricted); Fluid Restricted; 1500 mL Fluid #Discharge Planning: #Ethics: DNAR / Limited SOTO   Interval History: 11/22/23 Hospital Day: 1 Patient with subjective improvement of her confusion since coming to the hospital. States she presented with nausea and some abdominal pain which has resolved with lingering nausea.  Interested in as needed antiemetics to help  with nausea and hopefully appetite as well.  Denies chest pain, palpitations, abdominal pain shortness of breath.    OBJECTIVE: Vital Signs: Temp:  [97.5 F (36.4 C)-97.8 F (36.6 C)] 97.5 F (36.4 C) Heart Rate:  [96-100] 98 Resp:  [16-20] 18 BP: (96-140)/(50-85) 96/58  Physical Exam: 11/22/23 Physical Exam Constitutional:      General: She is not in acute distress.  Appearance: She is not toxic-appearing.  HENT:     Head: Normocephalic and atraumatic.   Cardiovascular:     Rate and Rhythm: Rhythm irregular.      Heart sounds: No murmur heard. Pulmonary:     Effort: Pulmonary effort is normal.     Breath sounds: Normal breath sounds.  Abdominal:     General: Abdomen is flat.     Palpations: Abdomen is soft.     Tenderness: There is no abdominal tenderness.   Musculoskeletal:     Right lower leg: 1+ Pitting Edema present.     Left lower leg: Swelling present. 2+ Pitting Edema present.   Neurological:     Mental Status: She is alert and oriented to person, place, and time. Mental status is at baseline.      CBC:  Results from last 7 days  Lab Units 11/22/23 0822 11/21/23 1133  WHITE BLOOD CELL COUNT 10*3/uL 3.41* 3.31*  HEMOGLOBIN g/dL 89.0* 89.3*  HEMATOCRIT % 33.2* 32.4*  PLATELET COUNT 10*3/uL 165 223   CMP:  Results from last 7 days  Lab Units 11/22/23 0208 11/21/23 2218 11/21/23 1133  SODIUM mmol/L 130*  --  129*  POTASSIUM mmol/L 5.0*  --  4.9*  CHLORIDE mmol/L 95*  --  95*  CO2 mmol/L 23  --  22  BUN mg/dL 97*  --  94*  CREATININE mg/dL 6.45* 6.48* 6.68*  CALCIUM  mg/dL 9.6  --  9.5  MAGNESIUM mg/dL 2.4  --   --   PHOSPHORUS mg/dL 5.6*  --   --   BILIRUBIN TOTAL mg/dL  --   --  1.1*  AST U/L  --   --  39  ALT U/L  --   --  29  TOTAL PROTEIN g/dL  --   --  6.9  ALBUMIN g/dL  --   --  3.5  ANION GAP mmol/L 12  --  12     Electronically signed by: Danielle Kristine Quance, MD 11/22/2023 1:10 PM

## 2023-11-22 NOTE — Telephone Encounter (Signed)
 A document form from [employer] has been faxed: Home Health Certificate (Order ID [id number]), to be filled out by provider. Send document back via Fax within 7-days. Document is located in providers tray at front office.          Fax number: [phone]

## 2023-11-23 NOTE — Care Plan (Signed)
 Problem: PAIN - ADULT Goal: Verbalizes/displays adequate comfort level or baseline comfort level Description: INTERVENTIONS: 1. Encourage pt to monitor pain and request assistance 2. Assess pain using appropriate pain scale 3. Administer analgesics based on type and severity of pain and evaluate response 4. Implement non-pharmacological measures as appropriate and evaluate response 5. Consider cultural and social influences on pain and pain management 6. Notify LIP if interventions unsuccessful or patient reports new pain Outcome: Progressing   Problem: INFECTION - ADULT Goal: Absence of infection during hospitalization Description: INTERVENTIONS: 1. Assess and monitor for signs and symptoms of infection 2. Monitor lab/diagnostic results 3. Monitor all insertion sites i.e., indwelling lines, tubes and drains 4. Monitor endotracheal (as able) and nasal secretions for changes in amount and color 5. Institute appropriate cooling/warming therapies per order 6. Administer medications as ordered 7. Instruct and encourage patient and family to use good hand hygiene technique 8. Identify and instruct in appropriate isolation precautions for identified infection/condition Outcome: Progressing Goal: Absence of fever/infection during anticipated neutropenic period Description: INTERVENTIONS 1. Monitor WBC 2. Administer growth factors as ordered 3. Implement neutropenic guidelines as ordered Outcome: Progressing   Problem: Safety - Adult Goal: Absence of infection during hospitalization Description: INTERVENTIONS: 1. Assess and monitor for signs and symptoms of infection 2. Monitor lab/diagnostic results 3. Monitor all insertion sites i.e., indwelling lines, tubes and drains 4. Monitor endotracheal (as able) and nasal secretions for changes in amount and color 5. Institute appropriate cooling/warming therapies per order 6. Administer medications as ordered 7. Instruct and encourage  patient and family to use good hand hygiene technique 8. Identify and instruct in appropriate isolation precautions for identified infection/condition Outcome: Progressing Goal: Free from fall injury Description: INTERVENTIONS: 1. Assess pt frequently for physical needs 2. Identify cognitive and physical deficits and behaviors that affect risk of falls. 3. Institute fall precautions as indicated by assessment. 4. Educate pt/family on patient safety including physical limitations 5. Instruct pt to call for assistance with activity based on assessment 6. Modify environment to reduce risk of injury 7. Consider OT/PT consult to assist with strengthening/mobility Outcome: Progressing Goal: Absence of infection during hospitalization Description: INTERVENTIONS: 1. Assess and monitor for signs and symptoms of infection 2. Monitor lab/diagnostic results 3. Monitor all insertion sites i.e., indwelling lines, tubes and drains 4. Monitor endotracheal (as able) and nasal secretions for changes in amount and color 5. Institute appropriate cooling/warming therapies per order 6. Administer medications as ordered 7. Instruct and encourage patient and family to use good hand hygiene technique 8. Identify and instruct in appropriate isolation precautions for identified infection/condition Outcome: Progressing   Problem: DISCHARGE PLANNING Goal: Discharge to home or other facility with appropriate resources Description: INTERVENTIONS: 1. Identify barriers to discharge w/pt and caregiver 2. Arrange for needed discharge resources and transportation as appropriate 3. Identify discharge learning needs (meds, wound care, etc) 4. Arrange for interpreters to assist at discharge as needed 5. Refer to Case Management Department for coordinating discharge planning if the patient needs post-hospital services based on physician order or complex needs related to functional status, cognitive ability or social support  system Outcome: Progressing   Problem: Chronic Conditions and Co-Morbidities Goal: Patient's chronic conditions and co-morbidity symptoms are monitored and maintained or improved Description: INTERVENTIONS: 1. Monitor and assess patient's chronic conditions and comorbid symptoms for stability, deterioration, or improvement 2. Collaborate with multidisciplinary team to address chronic and comorbid conditions and prevent exacerbation or deterioration 3. Update acute care plan with appropriate goals if chronic or  comorbid symptoms are exacerbated and prevent overall improvement and discharge Outcome: Progressing   Problem: Pain Goal: Improvement in pain assessment Outcome: Progressing   Problem: Health Behavior: Goal: MCB Ability to state ways to decrease the risk of falls will be met by discharge Description: Ability to state ways to decrease the risk of falls will improve by discharge Outcome: Progressing   Problem: Safety: Goal: Will remain free from falls by discharge Description: Will remain free from falls by discharge Outcome: Progressing

## 2023-11-23 NOTE — Progress Notes (Signed)
 [facility] Nephrology Progress Note  Follow up for AKI, CKD 3/4.  Hospital Day: 2  Subjective: Interval History: Tachycardic, blood pressure stable. Lethargic.  Review of Systems: negative except as noted above  Objective:   Vital signs for last 24 hours: Temp:  [97.9 F (36.6 C)-98.4 F (36.9 C)] 98 F (36.7 C) Heart Rate:  [97-119] 97 Resp:  [16-20] 16 BP: (62-133)/(49-92) 133/92 Today's weight: 108 kg (238 lb 12.8 oz)  Intake/Output last 24 hours:  Intake/Output Summary (Last 24 hours) at 11/23/2023 1204 Last data filed at 11/22/2023 1810 Gross per 24 hour  Intake 660 ml  Output --  Net 660 ml    Medications: Scheduled Meds: apixaban , 5 mg, oral, BID aspirin, 81 mg, oral, Daily atorvastatin , 20 mg, oral, At Bedtime budesonide , 0.5 mg, nebulization, RDaily cholecalciferol (VITAMIN D3), 1,000 Units, oral, Daily ezetimibe , 10 mg, oral, Daily [Held by provider] gabapentin , 300 mg, oral, Daily insulin  glargine, 10 Units, subcutaneous, QAM insulin  aspart (NovoLOG )/insulin  lispro (HumaLOG) injection (WF), 0-12 Units, subcutaneous, TID PC loratadine, 10 mg, oral, BID omeprazole , 40 mg, oral, Daily [Held by provider] torsemide, 40 mg, oral, BID - Transplant    PRN Meds: .  acetaminophen .  albuterol sulfate .  [Held by provider] clonazePAM  .  [Held by provider] cyclobenzaprine  .  dextrose .  dextrose .  ondansetron    Physical Exam: General appearance: alert, appears stated age, cooperative, and no distress Head: normocephalic, atraumatic Eyes: Sclera anicteric, no conjunctival pallor Lungs: Ctab Heart: s1s2 present, regular rate and rhythm, no murmurs Abdomen: is soft, no tenderness Extremities: Trace to 1+ edema L > R Skin: Skin color, texture, turgor normal. No rashes or lesions Neurologic: alert and oriented times 3   Labs Recent Results (from the past 48 hours)  B-Type Natriuretic Peptide (BNP)   Collection Time: 11/21/23  1:08 PM  Result Value Ref  Range   B-Type Natriuretic Peptide (BNP) 1,466 (H) <100 pg/mL  Lactate, Whole Blood with 2 Hour Reflex   Collection Time: 11/21/23  1:08 PM  Result Value Ref Range   Lactate 2.62 (H) 0.90 - 1.70 mmol/L  Urinalysis with Reflex to Microscopic   Collection Time: 11/21/23  2:24 PM  Result Value Ref Range   Color, Urine Yellow Yellow   Clarity, Urine Clear Clear   Specific Gravity, Urine 1.012 1.005 - 1.025   pH, Urine 5.0 5.0 - 8.0   Protein, Urine Negative Negative, 10 , 20  mg/dL   Glucose, Urine Negative Negative, 30 , 50  mg/dL   Ketones, Urine Negative Negative, Trace mg/dL   Bilirubin, Urine Negative Negative   Blood, Urine Negative Negative, Trace   Nitrite, Urine Negative Negative   Leukocyte Esterase, Urine Negative Negative, 25   Urobilinogen, Urine Normal <2.0 mg/dL  Urine Culture   Collection Time: 11/21/23  2:24 PM   Specimen: Clean Catch; Urine  Result Value Ref Range   Urine Culture No Growth   Urea Nitrogen, Random Urine   Collection Time: 11/21/23  2:24 PM  Result Value Ref Range   Urea Nitrogen, Urine 423 Not Established mg/dL   Creatinine, Urine 58 >=20 mg/dL   Urea Nitrogen/Creatinine Ratio, Urine 7,293 mg/g creat  Lactate, Whole Blood   Collection Time: 11/21/23  5:49 PM  Result Value Ref Range   Lactate 3.31 (H) 0.90 - 1.70 mmol/L  POC Glucose   Collection Time: 11/21/23  6:45 PM  Result Value Ref Range   Glucose, POC 176 (H) 70 - 99 mg/dL  Cystatin C  With GFR   Collection Time: 11/21/23 10:18 PM  Result Value Ref Range   Cystatin C 2.92 (H) 0.51 - 1.05 mg/L   Cystatin C eGFR 15 (L) >59 mL/min/1.47m2   Creatinine 3.51 (H) 0.60 - 1.20 mg/dL  TSH   Collection Time: 11/21/23 10:18 PM  Result Value Ref Range   TSH 2.763 0.450 - 5.330 uIU/mL  Lactic Acid   Collection Time: 11/21/23 10:18 PM  Result Value Ref Range   Lactic Acid 3.0 (H) 0.5 - 2.2 mmol/L  POC Glucose   Collection Time: 11/21/23 11:18 PM  Result Value Ref Range   Glucose, POC 209 (H)  70 - 99 mg/dL  Basic Metabolic Panel   Collection Time: 11/22/23  2:08 AM  Result Value Ref Range   Sodium 130 (L) 136 - 145 mmol/L   Potassium 5.0 (H) 3.4 - 4.5 mmol/L   Chloride 95 (L) 98 - 107 mmol/L   CO2 23 21 - 31 mmol/L   Anion Gap 12 6 - 14 mmol/L   Glucose, Random 206 (H) 70 - 99 mg/dL   Blood Urea Nitrogen (BUN) 97 (H) 7 - 25 mg/dL   Creatinine 6.45 (H) 9.39 - 1.20 mg/dL   eGFR 13 (L) >40 fO/fpw/8.26f7   Calcium  9.6 8.6 - 10.3 mg/dL   BUN/Creatinine Ratio 27.4 (H) 10.0 - 20.0  Phosphorus   Collection Time: 11/22/23  2:08 AM  Result Value Ref Range   Phosphorus 5.6 (H) 2.5 - 5.0 mg/dL  Magnesium   Collection Time: 11/22/23  2:08 AM  Result Value Ref Range   Magnesium 2.4 1.9 - 2.7 mg/dL  Lactic Acid   Collection Time: 11/22/23  2:08 AM  Result Value Ref Range   Lactic Acid 2.7 (H) 0.5 - 2.2 mmol/L  POC Glucose   Collection Time: 11/22/23  8:21 AM  Result Value Ref Range   Glucose, POC 206 (H) 70 - 99 mg/dL  CBC without Differential   Collection Time: 11/22/23  8:22 AM  Result Value Ref Range   WBC 3.41 (L) 4.40 - 11.00 10*3/uL   RBC 4.06 (L) 4.10 - 5.10 10*6/uL   Hemoglobin 10.9 (L) 12.3 - 15.3 g/dL   Hematocrit 66.7 (L) 64.0 - 44.6 %   Mean Corpuscular Volume (MCV) 81.7 80.0 - 96.0 fL   Mean Corpuscular Hemoglobin (MCH) 26.9 (L) 27.5 - 33.2 pg   Mean Corpuscular Hemoglobin Conc (MCHC) 32.9 (L) 33.0 - 37.0 g/dL   Red Cell Distribution Width (RDW) 18.7 (H) 12.3 - 17.0 %   Platelet Count (PLT) 165 150 - 450 10*3/uL   Mean Platelet Volume (MPV) 8.5 6.8 - 10.2 fL  Lactic Acid   Collection Time: 11/22/23 11:59 AM  Result Value Ref Range   Lactic Acid 1.9 0.5 - 2.2 mmol/L  POC Glucose   Collection Time: 11/22/23 12:37 PM  Result Value Ref Range   Glucose, POC 188 (H) 70 - 99 mg/dL  POC Glucose   Collection Time: 11/22/23  5:23 PM  Result Value Ref Range   Glucose, POC 114 (H) 70 - 99 mg/dL  POC Glucose   Collection Time: 11/22/23  8:12 PM  Result Value Ref  Range   Glucose, POC 108 (H) 70 - 99 mg/dL  Basic Metabolic Panel   Collection Time: 11/23/23 12:30 AM  Result Value Ref Range   Sodium 131 (L) 136 - 145 mmol/L   Potassium 4.9 (H) 3.4 - 4.5 mmol/L   Chloride 95 (L) 98 - 107 mmol/L  CO2 23 21 - 31 mmol/L   Anion Gap 13 6 - 14 mmol/L   Glucose, Random 91 70 - 99 mg/dL   Blood Urea Nitrogen (BUN) 99 (H) 7 - 25 mg/dL   Creatinine 6.41 (H) 9.39 - 1.20 mg/dL   eGFR 13 (L) >40 fO/fpw/8.26f7   Calcium  9.8 8.6 - 10.3 mg/dL   BUN/Creatinine Ratio 27.7 (H) 10.0 - 20.0  Phosphorus   Collection Time: 11/23/23 12:30 AM  Result Value Ref Range   Phosphorus 5.7 (H) 2.5 - 5.0 mg/dL  Magnesium   Collection Time: 11/23/23 12:30 AM  Result Value Ref Range   Magnesium 2.5 1.9 - 2.7 mg/dL  CBC without Differential   Collection Time: 11/23/23 12:30 AM  Result Value Ref Range   WBC 3.40 (L) 4.40 - 11.00 10*3/uL   RBC 3.96 (L) 4.10 - 5.10 10*6/uL   Hemoglobin 10.8 (L) 12.3 - 15.3 g/dL   Hematocrit 67.4 (L) 64.0 - 44.6 %   Mean Corpuscular Volume (MCV) 82.0 80.0 - 96.0 fL   Mean Corpuscular Hemoglobin (MCH) 27.3 (L) 27.5 - 33.2 pg   Mean Corpuscular Hemoglobin Conc (MCHC) 33.3 33.0 - 37.0 g/dL   Red Cell Distribution Width (RDW) 18.3 (H) 12.3 - 17.0 %   Platelet Count (PLT) 214 150 - 450 10*3/uL   Mean Platelet Volume (MPV) 8.4 6.8 - 10.2 fL  POC Glucose   Collection Time: 11/23/23  7:50 AM  Result Value Ref Range   Glucose, POC 85 70 - 99 mg/dL  Blood Gas, Venous   Collection Time: 11/23/23 10:51 AM  Result Value Ref Range   pH, Blood Gas 7.419 7.320 - 7.420   pCO2, Venous 35.9 None Defined mmHg   pO2, Venous 51.6 None Defined mmHg   HCO3, Blood Gas 23 21 - 28 mmol/L   Base Deficit (-) / Base Excess -1.3 -2.0 - 2.0 mmol/L   O2 Saturation, Venous (measured) 82.7 (H) 70.0 - 80.0 %   Source, Blood Gas Venous   POC Glucose   Collection Time: 11/23/23 10:51 AM  Result Value Ref Range   Glucose, POC 97 70 - 99 mg/dL  Lactic Acid    Collection Time: 11/23/23 11:16 AM  Result Value Ref Range   Lactic Acid 1.7 0.5 - 2.2 mmol/L     Imaging: Radiology Results (last 21 days)     Procedure Component Value Units Date/Time   US  Renal Bilateral Complete [[id number]] Collected: 11/21/23 2237   Order Status: Completed Updated: 11/21/23 2241   Narrative:     CLINICAL DATA:  Acute kidney injury.  EXAM: RENAL / URINARY TRACT ULTRASOUND COMPLETE  COMPARISON:  July 21, 2023  FINDINGS: Right Kidney:  Renal measurements: 12.2 cm x 4.5 cm x 4.7 cm = volume: 135.6 mL. Echogenicity within normal limits. No mass or hydronephrosis visualized.  Left Kidney:  Renal measurements: 10.1 cm x 5.8 cm x 4.7 cm = volume: 144.1 mL. Echogenicity within normal limits. No mass or hydronephrosis visualized.  Bladder:  Appears normal for degree of bladder distention.  Other:  None.    Impression:     Unremarkable renal ultrasound.   Electronically Signed   By: Suzen Dials M.D.   On: 11/21/2023 22:39    CT Chest Abdomen Pelvis WO Contrast [[id number]] Collected: 11/21/23 1635   Order Status: Completed Updated: 11/21/23 1655   Narrative:     CLINICAL DATA:  Pulmonary edema versus pneumonia. Abdominal pain and tachycardia. Elevated lactate.  EXAM: CT CHEST, ABDOMEN AND PELVIS  WITHOUT CONTRAST  TECHNIQUE: Multidetector CT imaging of the chest, abdomen and pelvis was performed following the standard protocol without IV contrast.  RADIATION DOSE REDUCTION: This exam was performed according to the departmental dose-optimization program which includes automated exposure control, adjustment of the mA and/or kV according to patient size and/or use of iterative reconstruction technique.  COMPARISON:  07/16/2023, chest CT 09/16/2022  FINDINGS: CT CHEST FINDINGS  Cardiovascular: Left-sided cardiac pacemaker is present. There is stable moderate cardiomegaly. Calcified plaque over the left main and 3 vessel coronary  arteries. Thoracic aorta is normal caliber. There is calcified plaque over the descending thoracic aorta. Remaining vascular structures are unremarkable.  Mediastinum/Nodes: No significant mediastinal or hilar adenopathy. Remaining mediastinal structures are unremarkable.  Lungs/Pleura: Lungs are adequately inflated without acute airspace process or effusion. Minimal scarring/atelectasis right base. Stable 7 mm nodule over the right lower lobe just above the diaphragm. Airways are normal.  Musculoskeletal: No focal abnormality.  CT ABDOMEN PELVIS FINDINGS  Hepatobiliary: Liver and biliary tree are normal. Mild cholelithiasis.  Pancreas: Normal.  Spleen: Normal.  Adrenals/Urinary Tract: Adrenal glands are normal. Kidneys are normal size without hydronephrosis or nephrolithiasis. Stable cyst over the left upper pole. 8 mm hyperdense nodule over the medial right midpole likely hyperdense cyst and somewhat difficult to visualize on the prior exam. Ureters and bladder are normal.  Stomach/Bowel: Stomach and small bowel are normal. Appendix not visualized. Colon is unremarkable.  Vascular/Lymphatic: Mild calcified plaque over the abdominal aorta which is normal in caliber. Calcified plaque over the celiac axis and superior mesenteric artery. No adenopathy.  Reproductive: Uterus and bilateral adnexa are unremarkable.  Other: No free fluid or focal inflammatory change. Evidence of previous abdominal wall hernia repair with diastasis of the rectus abdominus muscles unchanged.  Musculoskeletal: Mild grade 1 anterolisthesis of L5 on S1 with bilateral L5 spondylolysis unchanged.    Impression:     1. No acute findings in the chest, abdomen or pelvis. 2. Stable moderate cardiomegaly. Atherosclerotic coronary artery disease. 3. Mild cholelithiasis. 4. Stable left renal cyst. 8 mm hyperdense nodule over the medial right midpole likely hyperdense cyst. 5. Aortic  atherosclerosis. 6. Stable grade 1 anterolisthesis of L5 on S1 with bilateral L5 spondylolysis. 7. Stable 7 mm nodule right lower lobe. Recommend follow-up chest CT 1 year to document 2 years of stability. This recommendation follows the consensus statement: Guidelines for Management of Incidental Pulmonary Nodules Detected on CT Images: From the Fleischner Society 2017; Radiology 2017; 284:228-243.  Aortic Atherosclerosis (ICD10-I70.0).   Electronically Signed   By: Toribio Agreste M.D.   On: 11/21/2023 16:53    CT Head WO Contrast [[id number]] Collected: 11/21/23 1403   Order Status: Completed Updated: 11/21/23 1410   Narrative:     CLINICAL DATA:  Altered mental status and difficulty sleeping.  EXAM: CT HEAD WITHOUT CONTRAST  TECHNIQUE: Contiguous axial images were obtained from the base of the skull through the vertex without intravenous contrast.  RADIATION DOSE REDUCTION: This exam was performed according to the departmental dose-optimization program which includes automated exposure control, adjustment of the mA and/or kV according to patient size and/or use of iterative reconstruction technique.  COMPARISON:  10/23/2023  FINDINGS: Brain: Ventricles, cisterns and other CSF spaces are normal. Stable old infarct versus chronic ischemic change over the right parietal region. No mass, mass effect, shift of midline structures or acute hemorrhage. No evidence of acute infarction.  Vascular: No hyperdense vessel or unexpected calcification.  Skull: Normal. Negative for fracture or focal  lesion.  Sinuses/Orbits: Orbits are normal.  Paranasal sinuses are clear.  Other: None.    Impression:     1. No acute findings. 2. 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   Electronically Signed   By: Toribio Agreste M.D.   On: 11/21/2023 14:08    US  Peripheral Venous Leg Unilat Left [[id number]] Collected: 11/21/23 1355    Order Status: Completed Updated: 11/21/23 1358   Narrative:     EXAM: ULTRASOUND DUPLEX OF THE LEFT LOWER EXTREMITY VEINS  TECHNIQUE: Duplex ultrasound using B-mode/gray scaled imaging and Doppler spectral analysis and color flow was obtained of the deep venous structures of the left lower extremity.  COMPARISON: None.  CLINICAL HISTORY: Pain and swelling.  FINDINGS: The visualized veins of the lower extremity are patent and free of echogenic thrombus. The veins demonstrate good compressibility with normal color flow study and spectral analysis.    Impression:     1. No evidence of DVT.  Electronically signed by: evalene coho 11/21/2023 01:56 PM EDT RP Workstation: [id number]    XR Chest 2 Views [[id number]] Collected: 11/21/23 1339   Order Status: Completed Updated: 11/21/23 1344   Narrative:     EXAM: 2 VIEW(S) XRAY OF THE CHEST 11/21/2023 01:33:38 PM  COMPARISON: 10/23/2023  CLINICAL HISTORY: Cough. Patient states I feel like I can't stay awake but then states she isn't sleeping at night either. Patient states she has been this way since leaving the hospital early July. This RN tried to ask patient more questions and her response as my daughter will be able to you how I've been acting.  FINDINGS:  LUNGS AND PLEURA: Lateral projection radiograph is significantly diminished in detail due to patient body habitus. Lung volumes are low. Asymmetric increased opacity within the left lower lung may reflect artifact from portable technique and increased body habitus. Underlying pleural effusion or airspace disease not excluded.  HEART AND MEDIASTINUM: Stable cardiac enlargement. Aortic atherosclerosis.  BONES AND SOFT TISSUES: Left chest wall ICD noted with leads in the right atrial appendage and right ventricle.    Impression:     1. Asymmetric increased opacity within the left base, possibly artifact. Underlying pleural effusion or airspace disease  not excluded. 2. Stable cardiac enlargement and aortic atherosclerosis.  Electronically signed by: Waddell Calk MD 11/21/2023 01:42 PM EDT RP Workstation: [id number]           Assessment/Plan: 1) Oliguric AKI likely due to Bactrim  induced nephrotoxicity in the setting of dual diuretic therapy, Farxiga  causing drug interaction, prerenal azotemia with FeUrea 25.6%.  Cannot rule out acute CRS exacerbation type III-will give trial of IV fluids x 24 hours with normal saline at 75 mL/h. 2) CKD stage IIIb/early stage IV, recurrent AKI's, diabetes, hypertension, CHF-last at baseline June, 2025 with creatinine range 1.6-1.8. 3)HFrEF 15 to 20% with recurrent exacerbations, pulmonary hypertension-consider inotropes if no improvement with fluid resuscitation. 4)Chronic hyponatremia, borderline hyperkalemia. 5)Type 2 diabetes with elevated blood glucose-continue insulin  based regimen. 6 )Hyperphosphatemia/metabolic bone disease-phosphorus above goal, will monitor with renal clearance    Howell King MD, FASN 11/23/2023 12:04 PM

## 2023-11-23 NOTE — Telephone Encounter (Signed)
 Form placed in Dr. Luigi folder

## 2023-11-25 ENCOUNTER — Telehealth: Payer: Self-pay | Admitting: Family Medicine

## 2023-11-25 NOTE — Telephone Encounter (Signed)
 A document form from [employer] has been faxed: Home Health Certificate (Order ID [id number]), to be filled out by provider. Send document back via Fax within 7-days. Document is located in providers tray at front office.          Fax number: [phone]

## 2023-11-26 NOTE — Telephone Encounter (Signed)
 Form placed in Dr. Luigi folder for signature

## 2023-11-26 NOTE — Telephone Encounter (Signed)
 Signed by provider and faxed to [facility]

## 2023-11-28 NOTE — Progress Notes (Signed)
 Episode of SVT up to 165 at 1845 self converted back to NSR at 1856. EKG completed. Vitals stable and patient asymptomatic.

## 2023-11-28 NOTE — Care Plan (Signed)
 Problem: PAIN - ADULT Goal: Verbalizes/displays adequate comfort level or baseline comfort level Description: INTERVENTIONS: 1. Encourage pt to monitor pain and request assistance 2. Assess pain using appropriate pain scale 3. Administer analgesics based on type and severity of pain and evaluate response 4. Implement non-pharmacological measures as appropriate and evaluate response 5. Consider cultural and social influences on pain and pain management 6. Notify LIP if interventions unsuccessful or patient reports new pain Outcome: Progressing   Problem: INFECTION - ADULT Goal: Absence of infection during hospitalization Description: INTERVENTIONS: 1. Assess and monitor for signs and symptoms of infection 2. Monitor lab/diagnostic results 3. Monitor all insertion sites i.e., indwelling lines, tubes and drains 4. Monitor endotracheal (as able) and nasal secretions for changes in amount and color 5. Institute appropriate cooling/warming therapies per order 6. Administer medications as ordered 7. Instruct and encourage patient and family to use good hand hygiene technique 8. Identify and instruct in appropriate isolation precautions for identified infection/condition Outcome: Progressing Goal: Absence of fever/infection during anticipated neutropenic period Description: INTERVENTIONS 1. Monitor WBC 2. Administer growth factors as ordered 3. Implement neutropenic guidelines as ordered Outcome: Progressing   Problem: Safety - Adult Goal: Absence of infection during hospitalization Description: INTERVENTIONS: 1. Assess and monitor for signs and symptoms of infection 2. Monitor lab/diagnostic results 3. Monitor all insertion sites i.e., indwelling lines, tubes and drains 4. Monitor endotracheal (as able) and nasal secretions for changes in amount and color 5. Institute appropriate cooling/warming therapies per order 6. Administer medications as ordered 7. Instruct and encourage  patient and family to use good hand hygiene technique 8. Identify and instruct in appropriate isolation precautions for identified infection/condition Outcome: Progressing Goal: Free from fall injury Description: INTERVENTIONS: 1. Assess pt frequently for physical needs 2. Identify cognitive and physical deficits and behaviors that affect risk of falls. 3. Institute fall precautions as indicated by assessment. 4. Educate pt/family on patient safety including physical limitations 5. Instruct pt to call for assistance with activity based on assessment 6. Modify environment to reduce risk of injury 7. Consider OT/PT consult to assist with strengthening/mobility Outcome: Progressing Goal: Absence of infection during hospitalization Description: INTERVENTIONS: 1. Assess and monitor for signs and symptoms of infection 2. Monitor lab/diagnostic results 3. Monitor all insertion sites i.e., indwelling lines, tubes and drains 4. Monitor endotracheal (as able) and nasal secretions for changes in amount and color 5. Institute appropriate cooling/warming therapies per order 6. Administer medications as ordered 7. Instruct and encourage patient and family to use good hand hygiene technique 8. Identify and instruct in appropriate isolation precautions for identified infection/condition Outcome: Progressing   Problem: DISCHARGE PLANNING Goal: Discharge to home or other facility with appropriate resources Description: INTERVENTIONS: 1. Identify barriers to discharge w/pt and caregiver 2. Arrange for needed discharge resources and transportation as appropriate 3. Identify discharge learning needs (meds, wound care, etc) 4. Arrange for interpreters to assist at discharge as needed 5. Refer to Case Management Department for coordinating discharge planning if the patient needs post-hospital services based on physician order or complex needs related to functional status, cognitive ability or social support  system Outcome: Progressing   Problem: Chronic Conditions and Co-Morbidities Goal: Patient's chronic conditions and co-morbidity symptoms are monitored and maintained or improved Description: INTERVENTIONS: 1. Monitor and assess patient's chronic conditions and comorbid symptoms for stability, deterioration, or improvement 2. Collaborate with multidisciplinary team to address chronic and comorbid conditions and prevent exacerbation or deterioration 3. Update acute care plan with appropriate goals if chronic or  comorbid symptoms are exacerbated and prevent overall improvement and discharge Outcome: Progressing   Problem: Pain Goal: Improvement in pain assessment Outcome: Progressing   Problem: Health Behavior: Goal: MCB Ability to state ways to decrease the risk of falls will be met by discharge Description: Ability to state ways to decrease the risk of falls will improve by discharge Outcome: Progressing   Problem: Safety: Goal: Will remain free from falls by discharge Description: Will remain free from falls by discharge Outcome: Progressing

## 2023-11-28 NOTE — Progress Notes (Signed)
 Cardiology Consult Progress Note   Referring Physician:  Nancylee Bennett Seat, MD Admission date:  11/21/2023 Primary team: Medicine Reason for Consult: HFrEF   Interval history/events:  No more episodes of VT  Planning for hospice on Monday with palliative dobutamine  Creatinine continues to improve on dobutamine  Current Medications:  Continuous Infusions: DOBUTamine, 2.5 mcg/kg/min, Last Rate: 2.5 mcg/kg/min (11/28/23 0323)   Scheduled Meds: aspirin, 81 mg, oral, Daily budesonide , 0.5 mg, nebulization, RDaily [Held by provider] gabapentin , 300 mg, oral, Daily insulin  glargine, 15 Units, subcutaneous, QAM insulin  aspart (NovoLOG )/insulin  lispro (HumaLOG) injection (WF), 0-12 Units, subcutaneous, TID PC loratadine, 10 mg, oral, BID omeprazole , 40 mg, oral, Daily   PRN Meds: .  acetaminophen .  albuterol sulfate .  benzocaine-menthol .  benzonatate  .  calcium  carbonate .  dextrose .  dextrose .  guaiFENesin .  hydrOXYzine .  ondansetron   Physical Examination  Temp:  [97.3 F (36.3 C)-97.9 F (36.6 C)] 97.3 F (36.3 C) Heart Rate:  [86-97] 94 Resp:  [15-18] 18 BP: (114-131)/(53-88) 127/88   Intake/Output Summary (Last 24 hours) at 11/28/2023 0918 Last data filed at 11/28/2023 0500 Gross per 24 hour  Intake 365.23 ml  Output 1150 ml  Net -784.77 ml    Wt Readings from Last 3 Encounters:  11/28/23 112 kg (247 lb 3.2 oz)  11/02/23 108 kg (238 lb 9.6 oz)  10/24/23 105 kg (231 lb 7.7 oz)    O2 Device: None (Room air)    @HEMO @  CONSTITUTIONAL: alert HEENT: oropharynx clear and moist,  CARDIOVASCULAR: Regular rhythm. No gallop, murmur, or rub. Normal S1/S2. Radial pulses intact. PULMONARY/CHEST WALL: no deformities, normal breath sounds bilaterally, normal work of breathing ABDOMINAL: soft, non-tender, non-distended EXTREMITIES: no edema, warm  NEUROLOGIC: alert,  Pertinent Labs and Studies:   Lab Results  Component Value Date   NA 132 (L) 11/28/2023   K  4.0 11/28/2023   CREATININE 2.01 (H) 11/28/2023   BUN 69 (H) 11/28/2023   CO2 24 11/28/2023   MG 2.6 11/28/2023   ALBUMIN 3.5 11/21/2023   BILITOT 1.1 (H) 11/21/2023   AST 39 11/21/2023   ALT 29 11/21/2023   BNP 1,466 (H) 11/21/2023   HGB 10.4 (L) 11/28/2023   PLT 199 11/28/2023   No components found for: TROPONINI   Impression and Recommendations:    1. HFrEF:  Continue dobutamine at 2.5 mcg/kg/min for now.  Finalizing plans for home hospice with dobutamine.  Will need ICD shock therapies stopped prior to going home.  She is actually now doing fairly well on Dobutamine.  Spoke with family member at bedside that she might do well at home for sometime on this therapy.  Thank you for inviting us  to participate in the care of this patient.   Tonya Maude Butt, MD

## 2023-11-28 NOTE — Progress Notes (Signed)
 ------------------------------------------------------------------------------- Attestation signed by Nancylee Bennett Seat, MD at 11/28/2023  3:45 PM I saw and evaluated the patient. I agree with the findings and plan of care as documented in the resident's note.  Nancylee Bennett Seat  Sun 11/28/2023 3:45 PM    -------------------------------------------------------------------------------  General Medicine [place] [facility] Progress Note  ASSESSMENT/PLAN: Tonya Evans is a 68 y.o. female with  severe HFrEF,  CKD IIIb/IV, paroxysmal A-fib (AC with Eliquis ), hypertension, T2DM, hyperlipidemia, CAD, who presented with altered mental status and fatigue and worsening renal function, found to be in cardiogenic shock. Now transitioning to hospice care with dobutamine for terminal heart failure.     #Cardiogenic Shock  #Heart Failure with Reduced EF (15-20%), poa, active #Pulmonary Hypertension -- Presenting with elevation in BNP to 1466 in addition to worsening renal function (sCr 3.3 from 1.7). Extremities cold on exam. Echocardiogram from 07/2023 showing LV ejection fraction 15-20% with severe diastolic dysfunction with elevated R atrial pressue, pulmonary hypertension and severe dilation and hypokinesis of the right ventricle. Failed GDMT for hypotension/intolerance, see clinic notes/HF/cardiology consult for more history. Not a condidate for more advanced heart failure therapies. Well known to heart failure team, nephrology, and palliative care.   - Interdisciplinary discussion with Palliative Care, Cardiology, Nephrology, primary team, patient and family has yielded ultimate decision to initiate dobutamine inotropic support while in hospital and transition home with hospice and dobutamine. She is additionally DNR/Limited per lengthy discussion with teams and confirmation by myself. We have engaged [employer], who has agreed to provide coordination and pharmacy. Patient's family would like discharge on  Sunday to prepare home. Currently visiting at hospital frequently. Will continue to manage inpatient until family is ready and [facility] can supply hospice needs, hopefully Monday 8/11.  Stopped Zetia  and Lipitor given overall life expectancy.  Plan  - Low Na diet with 2000 cc fluid restriction - Strict Intake and Output  - Continuous pulse ox - Continuous telemetry - Continue home inhaler - S/p PICC 8/7 for central infusion - Continue dobutamine gtt, transition to hospice with dobutamine gtt - S/p amiodarone bolus 8/7-8 overnight  #Cardiorenal syndrome #Acute Kidney Injury on CKD IIIb/IV, stable #Bactrim  induced nephrotoxicity #Altered Mental Status, poa, improving #Uremia -- Presenting with confusion over past week, forgetting names with acute worsening of confusion and lack of focus consistent with uremic encephalopathy and low perfusion state secondary to severe heart failure. Ongoing. With sCr of 3.31 from baseline of 1.7 several weeks prior.  BUN 94 from baseline 50s. no additional electrolyte abnormalities at this time. Making sufficient urine. FeUrea 25.6% to suggest pre-renal cause of AKI secondary to over-diuresis (metolazone recently reduced to three times weekly with nephrology). Confounded by prolonged course of Bactrim  -- sCr improving with initiation of inotropic support and patient's mentation is improving, although waxes and wanes, may be too early to draw conclusions Plan  - Daily CBC, BMP, Mg - Holding home medications that may worsen confusion: Klonopin , Flexeril , gabapentin     #Lactic Acidosis, resolved -- Lactate on presentation 2.62, has been stable with trending lactic acid 3.0 to 2.7. Very low suspicion for acute ischemic process including shock as patient has been normotensive. Bowel ischemia unlikely based on CT abdomen. No evidence of infection or SIRS criteria to suggest component of sepsis. No evidence of PE or pericardial effusion noted on CT scan, although  without contrast. Likely secondary to chronically low perfusing state secondary to her very severe heart failure.   #LLE swelling, poa, active Presenting with  left lower extremity swelling and tenderness to level of the knee.  DVT ultrasound in the ED negative for acute DVT.  Patient already taking Eliquis  for A-fib, so no need to empirically anticoagulate.  Will monitor for now.  Unclear if this is edema versus lymphedema Plan  - As needed Tylenol for pain - CTM    #Nausea With mild abdominal pain on presentation, lipase normal. Presented with subjective malaise and nausea has improved with anti-emetics.   PLAN:  - As needed zofran  - Monitor qtc   Chronic medical conditions #T2DM :: lantus  15, sliding scale, consider further titration for higher BG's #Atrial fibrillation:: continue eliquis    Hospital Checklist: #DVT PPX: Fully Anticoagulated #FEN: Adult Diet- Sodium Controlled; 2 gm Na limit; Fluid Restricted; 2000 ML Fluid #Discharge Planning: Home with hospice on Monday on dobutamine infusion #Ethics: DNAR / Limited SOTO   Interval History: 11/28/23 Hospital Day: 7 Patient sitting upright in chair fully oriented and talkative with family at bedside. Grumpy about multiple medical professionals asking orientation questions since she has returned to her mental baseline. endorsing some Itching and anxiety overnight but without acute complaints at the moment.  Looking forward to being discharged from hospital, states she is feeling much better.    OBJECTIVE: Vital Signs: Temp:  [97.3 F (36.3 C)-97.9 F (36.6 C)] 97.3 F (36.3 C) Heart Rate:  [86-98] 97 Resp:  [15-18] 18 BP: (114-131)/(38-111) 122/52  Physical Exam: 11/28/23 Physical Exam Constitutional:      General: She is not in acute distress.    Appearance: She is not toxic-appearing.  HENT:     Head: Normocephalic and atraumatic.     Mouth/Throat:     Mouth: Mucous membranes are moist.   Cardiovascular:      Rate and Rhythm: Regular rhythm.     Heart sounds: Murmur heard.  Pulmonary:     Effort: Pulmonary effort is normal.     Breath sounds: Normal breath sounds.  Abdominal:     General: Abdomen is flat.     Palpations: Abdomen is soft.     Tenderness: There is no abdominal tenderness.   Musculoskeletal:     Right lower leg: 1+ Pitting Edema present.     Left lower leg: Swelling present. 2+ Pitting Edema present.   Skin:    General: Skin is warm and dry.   Neurological:     General: No focal deficit present.     Mental Status: She is oriented to person, place, and time. Mental status is at baseline.   Psychiatric:        Mood and Affect: Mood normal.        Thought Content: Thought content normal.     CBC:  Results from last 7 days  Lab Units 11/28/23 0350 11/27/23 0411 11/26/23 0550 11/25/23 0123  WHITE BLOOD CELL COUNT 10*3/uL 4.35* 4.59 6.22 4.07*  HEMOGLOBIN g/dL 89.5* 89.9* 9.7* 89.4*  HEMATOCRIT % 31.7* 30.5* 29.7* 32.1*  PLATELET COUNT 10*3/uL 199 209 193 209   CMP:  Results from last 7 days  Lab Units 11/28/23 0350 11/27/23 0456 11/26/23 0550 11/25/23 0123 11/21/23 2218 11/21/23 1133  SODIUM mmol/L 132* 129* 129* 128*   < > 129*  POTASSIUM mmol/L 4.0 4.1 4.2 4.7*   < > 4.9*  CHLORIDE mmol/L 101 99 98 96*   < > 95*  CO2 mmol/L 24 23 21  20*   < > 22  BUN mg/dL 69* 80* 88* 94*   < > 94*  CREATININE  mg/dL 7.98* 7.48* 7.33* 6.75*   < > 3.31*  CALCIUM  mg/dL 8.4* 9.0 8.7 9.2   < > 9.5  MAGNESIUM mg/dL 2.6 2.6 2.5 2.6   < >  --   PHOSPHORUS mg/dL 2.3* 2.6 3.5 4.9   < >  --   BILIRUBIN TOTAL mg/dL  --   --   --   --   --  1.1*  AST U/L  --   --   --   --   --  39  ALT U/L  --   --   --   --   --  29  TOTAL PROTEIN g/dL  --   --   --   --   --  6.9  ALBUMIN g/dL  --   --   --   --   --  3.5  ANION GAP mmol/L 7 7 10 12    < > 12   < > = values in this interval not displayed.     Electronically signed by: Danielle Kristine Quance, MD 11/28/2023 10:19 AM

## 2023-11-28 NOTE — Progress Notes (Signed)
 [facility] Nephrology Progress Note  Follow up for AKI, CKD 3/4.  Hospital Day: 7  Subjective: Interval History: Clinically improving, sitting up in a chair. Remains on dobutamine at 2.5mcg/kg/min.  Hemodynamically stable and at room air. Some lower extremity swelling present.  Review of Systems: negative except as noted above  Objective:   Vital signs for last 24 hours: Temp:  [97.3 F (36.3 C)-98.2 F (36.8 C)] 98.2 F (36.8 C) Heart Rate:  [86-98] 97 Resp:  [15-19] 19 BP: (114-131)/(38-111) 130/72 Today's weight: 112 kg (247 lb 3.2 oz)  Intake/Output last 24 hours:  Intake/Output Summary (Last 24 hours) at 11/28/2023 1111 Last data filed at 11/28/2023 0800 Gross per 24 hour  Intake 485.23 ml  Output 1150 ml  Net -664.77 ml    Medications: Scheduled Meds: aspirin, 81 mg, oral, Daily budesonide , 0.5 mg, nebulization, RDaily [Held by provider] gabapentin , 300 mg, oral, Daily insulin  glargine, 15 Units, subcutaneous, QAM insulin  aspart (NovoLOG )/insulin  lispro (HumaLOG) injection (WF), 0-12 Units, subcutaneous, TID PC loratadine, 10 mg, oral, BID omeprazole , 40 mg, oral, Daily    PRN Meds: .  acetaminophen .  albuterol sulfate .  benzocaine-menthol .  benzonatate  .  calcium  carbonate .  dextrose .  dextrose .  guaiFENesin .  hydrOXYzine .  ondansetron    Physical Exam: General appearance: alert, appears stated age, cooperative, and no distress Head: normocephalic, atraumatic Eyes: Sclera anicteric, no conjunctival pallor Lungs: Ctab Heart: s1s2 present, regular rate and rhythm, no murmurs Abdomen: is obese,soft, no tenderness, bs present. Extremities:  1+ edema L > R Skin: Skin color, texture, turgor normal. No rashes or lesions Neurologic: alert and oriented times 3   Labs Recent Results (from the past 48 hours)  POC Glucose   Collection Time: 11/26/23 11:34 AM  Result Value Ref Range   Glucose, POC 283 (H) 70 - 99 mg/dL  POC Glucose   Collection  Time: 11/26/23  3:29 PM  Result Value Ref Range   Glucose, POC 271 (H) 70 - 99 mg/dL  POC Glucose   Collection Time: 11/26/23  4:56 PM  Result Value Ref Range   Glucose, POC 179 (H) 70 - 99 mg/dL  POC Glucose   Collection Time: 11/26/23  8:15 PM  Result Value Ref Range   Glucose, POC 269 (H) 70 - 99 mg/dL  CBC without Differential   Collection Time: 11/27/23  4:11 AM  Result Value Ref Range   WBC 4.59 4.40 - 11.00 10*3/uL   RBC 3.75 (L) 4.10 - 5.10 10*6/uL   Hemoglobin 10.0 (L) 12.3 - 15.3 g/dL   Hematocrit 69.4 (L) 64.0 - 44.6 %   Mean Corpuscular Volume (MCV) 81.6 80.0 - 96.0 fL   Mean Corpuscular Hemoglobin (MCH) 26.8 (L) 27.5 - 33.2 pg   Mean Corpuscular Hemoglobin Conc (MCHC) 32.9 (L) 33.0 - 37.0 g/dL   Red Cell Distribution Width (RDW) 19.1 (H) 12.3 - 17.0 %   Platelet Count (PLT) 209 150 - 450 10*3/uL   Mean Platelet Volume (MPV) 8.2 6.8 - 10.2 fL  Phosphorus   Collection Time: 11/27/23  4:56 AM  Result Value Ref Range   Phosphorus 2.6 2.5 - 5.0 mg/dL  Magnesium   Collection Time: 11/27/23  4:56 AM  Result Value Ref Range   Magnesium 2.6 1.9 - 2.7 mg/dL  Basic Metabolic Panel   Collection Time: 11/27/23  4:56 AM  Result Value Ref Range   Sodium 129 (L) 136 - 145 mmol/L   Potassium 4.1 3.4 - 4.5 mmol/L  Chloride 99 98 - 107 mmol/L   CO2 23 21 - 31 mmol/L   Anion Gap 7 6 - 14 mmol/L   Glucose, Random 345 (H) 70 - 99 mg/dL   Blood Urea Nitrogen (BUN) 80 (H) 7 - 25 mg/dL   Creatinine 7.48 (H) 9.39 - 1.20 mg/dL   eGFR 20 (L) >40 fO/fpw/8.26f7   Calcium  9.0 8.6 - 10.3 mg/dL   BUN/Creatinine Ratio 31.9 (H) 10.0 - 20.0  POC Glucose   Collection Time: 11/27/23  7:10 AM  Result Value Ref Range   Glucose, POC 298 (H) 70 - 99 mg/dL  POC Glucose   Collection Time: 11/27/23 11:17 AM  Result Value Ref Range   Glucose, POC 243 (H) 70 - 99 mg/dL  POC Glucose   Collection Time: 11/27/23  3:28 PM  Result Value Ref Range   Glucose, POC 366 (H) 70 - 99 mg/dL  POC Glucose    Collection Time: 11/27/23  5:33 PM  Result Value Ref Range   Glucose, POC 360 (H) 70 - 99 mg/dL  POC Glucose   Collection Time: 11/27/23 10:39 PM  Result Value Ref Range   Glucose, POC 350 (H) 70 - 99 mg/dL  Phosphorus   Collection Time: 11/28/23  3:50 AM  Result Value Ref Range   Phosphorus 2.3 (L) 2.5 - 5.0 mg/dL  Magnesium   Collection Time: 11/28/23  3:50 AM  Result Value Ref Range   Magnesium 2.6 1.9 - 2.7 mg/dL  Basic Metabolic Panel   Collection Time: 11/28/23  3:50 AM  Result Value Ref Range   Sodium 132 (L) 136 - 145 mmol/L   Potassium 4.0 3.4 - 4.5 mmol/L   Chloride 101 98 - 107 mmol/L   CO2 24 21 - 31 mmol/L   Anion Gap 7 6 - 14 mmol/L   Glucose, Random 312 (H) 70 - 99 mg/dL   Blood Urea Nitrogen (BUN) 69 (H) 7 - 25 mg/dL   Creatinine 7.98 (H) 9.39 - 1.20 mg/dL   eGFR 27 (L) >40 fO/fpw/8.26f7   Calcium  8.4 (L) 8.6 - 10.3 mg/dL   BUN/Creatinine Ratio 34.3 (H) 10.0 - 20.0  CBC without Differential   Collection Time: 11/28/23  3:50 AM  Result Value Ref Range   WBC 4.35 (L) 4.40 - 11.00 10*3/uL   RBC 3.83 (L) 4.10 - 5.10 10*6/uL   Hemoglobin 10.4 (L) 12.3 - 15.3 g/dL   Hematocrit 68.2 (L) 64.0 - 44.6 %   Mean Corpuscular Volume (MCV) 82.7 80.0 - 96.0 fL   Mean Corpuscular Hemoglobin (MCH) 27.1 (L) 27.5 - 33.2 pg   Mean Corpuscular Hemoglobin Conc (MCHC) 32.7 (L) 33.0 - 37.0 g/dL   Red Cell Distribution Width (RDW) 18.7 (H) 12.3 - 17.0 %   Platelet Count (PLT) 199 150 - 450 10*3/uL   Mean Platelet Volume (MPV) 7.8 6.8 - 10.2 fL  POC Glucose   Collection Time: 11/28/23  7:39 AM  Result Value Ref Range   Glucose, POC 293 (H) 70 - 99 mg/dL     Imaging: Radiology Results (last 21 days)     Procedure Component Value Units Date/Time   US  Renal Bilateral Complete [[id number]] Collected: 11/21/23 2237   Order Status: Completed Updated: 11/21/23 2241   Narrative:     CLINICAL DATA:  Acute kidney injury.  EXAM: RENAL / URINARY TRACT ULTRASOUND  COMPLETE  COMPARISON:  July 21, 2023  FINDINGS: Right Kidney:  Renal measurements: 12.2 cm x 4.5 cm x 4.7 cm = volume:  135.6 mL. Echogenicity within normal limits. No mass or hydronephrosis visualized.  Left Kidney:  Renal measurements: 10.1 cm x 5.8 cm x 4.7 cm = volume: 144.1 mL. Echogenicity within normal limits. No mass or hydronephrosis visualized.  Bladder:  Appears normal for degree of bladder distention.  Other:  None.    Impression:     Unremarkable renal ultrasound.   Electronically Signed   By: Suzen Dials M.D.   On: 11/21/2023 22:39    CT Chest Abdomen Pelvis WO Contrast [[id number]] Collected: 11/21/23 1635   Order Status: Completed Updated: 11/21/23 1655   Narrative:     CLINICAL DATA:  Pulmonary edema versus pneumonia. Abdominal pain and tachycardia. Elevated lactate.  EXAM: CT CHEST, ABDOMEN AND PELVIS WITHOUT CONTRAST  TECHNIQUE: Multidetector CT imaging of the chest, abdomen and pelvis was performed following the standard protocol without IV contrast.  RADIATION DOSE REDUCTION: This exam was performed according to the departmental dose-optimization program which includes automated exposure control, adjustment of the mA and/or kV according to patient size and/or use of iterative reconstruction technique.  COMPARISON:  07/16/2023, chest CT 09/16/2022  FINDINGS: CT CHEST FINDINGS  Cardiovascular: Left-sided cardiac pacemaker is present. There is stable moderate cardiomegaly. Calcified plaque over the left main and 3 vessel coronary arteries. Thoracic aorta is normal caliber. There is calcified plaque over the descending thoracic aorta. Remaining vascular structures are unremarkable.  Mediastinum/Nodes: No significant mediastinal or hilar adenopathy. Remaining mediastinal structures are unremarkable.  Lungs/Pleura: Lungs are adequately inflated without acute airspace process or effusion. Minimal scarring/atelectasis right base.  Stable 7 mm nodule over the right lower lobe just above the diaphragm. Airways are normal.  Musculoskeletal: No focal abnormality.  CT ABDOMEN PELVIS FINDINGS  Hepatobiliary: Liver and biliary tree are normal. Mild cholelithiasis.  Pancreas: Normal.  Spleen: Normal.  Adrenals/Urinary Tract: Adrenal glands are normal. Kidneys are normal size without hydronephrosis or nephrolithiasis. Stable cyst over the left upper pole. 8 mm hyperdense nodule over the medial right midpole likely hyperdense cyst and somewhat difficult to visualize on the prior exam. Ureters and bladder are normal.  Stomach/Bowel: Stomach and small bowel are normal. Appendix not visualized. Colon is unremarkable.  Vascular/Lymphatic: Mild calcified plaque over the abdominal aorta which is normal in caliber. Calcified plaque over the celiac axis and superior mesenteric artery. No adenopathy.  Reproductive: Uterus and bilateral adnexa are unremarkable.  Other: No free fluid or focal inflammatory change. Evidence of previous abdominal wall hernia repair with diastasis of the rectus abdominus muscles unchanged.  Musculoskeletal: Mild grade 1 anterolisthesis of L5 on S1 with bilateral L5 spondylolysis unchanged.    Impression:     1. No acute findings in the chest, abdomen or pelvis. 2. Stable moderate cardiomegaly. Atherosclerotic coronary artery disease. 3. Mild cholelithiasis. 4. Stable left renal cyst. 8 mm hyperdense nodule over the medial right midpole likely hyperdense cyst. 5. Aortic atherosclerosis. 6. Stable grade 1 anterolisthesis of L5 on S1 with bilateral L5 spondylolysis. 7. Stable 7 mm nodule right lower lobe. Recommend follow-up chest CT 1 year to document 2 years of stability. This recommendation follows the consensus statement: Guidelines for Management of Incidental Pulmonary Nodules Detected on CT Images: From the Fleischner Society 2017; Radiology 2017; 284:228-243.  Aortic  Atherosclerosis (ICD10-I70.0).   Electronically Signed   By: Toribio Agreste M.D.   On: 11/21/2023 16:53    CT Head WO Contrast [[id number]] Collected: 11/21/23 1403   Order Status: Completed Updated: 11/21/23 1410   Narrative:     CLINICAL  DATA:  Altered mental status and difficulty sleeping.  EXAM: CT HEAD WITHOUT CONTRAST  TECHNIQUE: Contiguous axial images were obtained from the base of the skull through the vertex without intravenous contrast.  RADIATION DOSE REDUCTION: This exam was performed according to the departmental dose-optimization program which includes automated exposure control, adjustment of the mA and/or kV according to patient size and/or use of iterative reconstruction technique.  COMPARISON:  10/23/2023  FINDINGS: Brain: Ventricles, cisterns and other CSF spaces are normal. Stable old infarct versus chronic ischemic change over the right parietal region. No mass, mass effect, shift of midline structures or acute hemorrhage. No evidence of acute infarction.  Vascular: No hyperdense vessel or unexpected calcification.  Skull: Normal. Negative for fracture or focal lesion.  Sinuses/Orbits: Orbits are normal.  Paranasal sinuses are clear.  Other: None.    Impression:     1. No acute findings. 2. 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   Electronically Signed   By: Toribio Agreste M.D.   On: 11/21/2023 14:08    US  Peripheral Venous Leg Unilat Left [[id number]] Collected: 11/21/23 1355   Order Status: Completed Updated: 11/21/23 1358   Narrative:     EXAM: ULTRASOUND DUPLEX OF THE LEFT LOWER EXTREMITY VEINS  TECHNIQUE: Duplex ultrasound using B-mode/gray scaled imaging and Doppler spectral analysis and color flow was obtained of the deep venous structures of the left lower extremity.  COMPARISON: None.  CLINICAL HISTORY: Pain and swelling.  FINDINGS: The visualized veins of the  lower extremity are patent and free of echogenic thrombus. The veins demonstrate good compressibility with normal color flow study and spectral analysis.    Impression:     1. No evidence of DVT.  Electronically signed by: evalene coho 11/21/2023 01:56 PM EDT RP Workstation: [id number]    XR Chest 2 Views [[id number]] Collected: 11/21/23 1339   Order Status: Completed Updated: 11/21/23 1344   Narrative:     EXAM: 2 VIEW(S) XRAY OF THE CHEST 11/21/2023 01:33:38 PM  COMPARISON: 10/23/2023  CLINICAL HISTORY: Cough. Patient states I feel like I can't stay awake but then states she isn't sleeping at night either. Patient states she has been this way since leaving the hospital early July. This RN tried to ask patient more questions and her response as my daughter will be able to you how I've been acting.  FINDINGS:  LUNGS AND PLEURA: Lateral projection radiograph is significantly diminished in detail due to patient body habitus. Lung volumes are low. Asymmetric increased opacity within the left lower lung may reflect artifact from portable technique and increased body habitus. Underlying pleural effusion or airspace disease not excluded.  HEART AND MEDIASTINUM: Stable cardiac enlargement. Aortic atherosclerosis.  BONES AND SOFT TISSUES: Left chest wall ICD noted with leads in the right atrial appendage and right ventricle.    Impression:     1. Asymmetric increased opacity within the left base, possibly artifact. Underlying pleural effusion or airspace disease not excluded. 2. Stable cardiac enlargement and aortic atherosclerosis.  Electronically signed by: Waddell Calk MD 11/21/2023 01:42 PM EDT RP Workstation: [id number]           Assessment/Plan: 1)Nonoliguric AKI likely due to Bactrim  induced nephrotoxicity in the setting of dual diuretic therapy, Farxiga  causing drug interaction and leading to CRS exacerbation with worsening AKI.  Peak BUN/creatinine  90/3.2, continues to trend down into recovery phase on renal dose dobutamine drip short-term.  Electrolytes, acid-base status and volume status improving.  Will  give dose of furosemide  60 mg IV x 1.  2) CKD stage IIIb/early stage IV, recurrent AKI's, diabetes, hypertension, CHF-last at baseline June, 2025 with creatinine range 1.6-1.8. 3)HFrEF 15 to 20% with recurrent exacerbations, pulmonary hypertension-on renal dose dobutamine, resume gentle preload reduction with furosemide  as needed.   5)Type 2 diabetes with elevated blood glucose-continue insulin  based regimen. 6 )Hyperphosphatemia/metabolic bone disease-phosphorus trending down with improved renal clearance. 7) Chronic normocytic anemia-H&H stable. Disposition-per documentation, family plans to transition to hospice care.   Howell King MD, FASN 11/28/2023 11:11 AM

## 2023-11-29 NOTE — Progress Notes (Signed)
 ------------------------------------------------------------------------------- Attestation signed by Glendia Evans Ee, MD at 11/29/2023  6:51 PM Attending Physician Attestation:   APP only - patient discharged on hospice before being seen by me. I reviewed all of the objective data independently.  I formulated the treatment plan and reviewed this note with [name], PA-C. I agree with the findings, assessment, and plan as documented.  Glendia Ee, MD, [facility] Interventional and Critical Care Cardiology [facility]    -------------------------------------------------------------------------------  [facility] is now [facility]                    [facility] General Heart Failure Progress Note    PCP: Tonya Christianne Tanda, MD Primary Cardiologist: Heart Failure Team [facility]  Consulting Cardiology Team:Allyson Almarie Decree PA-C and Dr. Glendia Ee  Principal Problem:   Acute metabolic encephalopathy   Assessment & Plan:   Chronic systolic and diastolic heart failure, end stage: Diagnosed in 2007, non-ischemic (LV dysfunction out of proportion to CAD). LVEF 15-20% since 2021. TTE on 08/03/2023 showed an LVEF of 15 to 20% with severe grade 3 diastolic dysfunction, biventricular dilation, severe TR with severe pulmonary hypertension and a severely dilated IVC.  Multiple admissions for HF and other issues this past year. Admitted for AMS with evidence of hypovolemia and cardiogenic shock with worsening renal function, which has improved on low dose dobutamine (VT on 5 mcg dose, now on 2.5). Volume overloaded. IV Lasix  given yesterday. CAD: DES to the LAD in 2021 (+ stress test). No angina.  PAF: CHADS2-VASc Score is 5 on Eliquis  AKI CKD: Baseline creatinine ~ 1.6-1.8, creatinine up to 3.54 despite gentle IV fluids but creatinine now 1.59 with starting low dose  Dobutamine. ICD in place: Originally implanted in 2008. Report this morning showed atrial burden 0%, SVT episodes 0%, thoracic impedence is higher (indicating hypovolemia/dry status).    Plan  She is being discharged to hospice care ([facility]) with home dobutamine (confirmed with palliative care team that hospice will be managing her dobutamine going forward).  Device representative will come by to deactivate her ICD tachy-therapies.  Time Spent: 45 minutes. Will review findings and finalize plan with attending physician.   We will sign off.   HPI     Tonya Evans is a 68 y.o. female with a past medicalhistory significant for heart failure, CAD, hypertension, ICD in place, paroxysmal atrial fibrillation, asthma, morbid obesity, type 2 diabetes mellitus, hyperlipidemia, CKD, anemia due to CKD and also iron deficiency anemia, gout, history of TIA, osteoarthritis who presented with a chief complaint of fatigue. We have been asked to see her at the kind request of the Hospitalist Service for evaluation of HFrEF with AKI, concer for over diuresis.   She has a history of a chronic systolic heart failure (nonischemic etiology) diagnosed in 2007 with initial ICD implantation in 2008.  Her echocardiogram back in 2019 showed an LVEF of 20 to 25% and her LVEF further reduced in 2021 to 15%.  She had a positive stress test in 2021 with DES to the proximal LAD.  She established care in the [facility] HF clinic back in December 2021.  She actually lost care to the HF team in 2023 and recently started office visits again in July 2024.  She had a generator change out for her ICD in November 2024.    She has been admitted to the hospital  multiple times this past year.   She was admitted from 07/10/2023 to 07/11/2023 due to slurred speech concerning for another TIA however neurology felt as though clinical picture was more related to severe dehydration/AKI than true TIA.  Her GDMT was held at  discharge due to hyperkalemia including furosemide , Imdur , losartan , and spironolactone .  She was evaluated in the ER on 07/08/2023 due to chest pain, CT of the chest showed no acute aortic syndrome.  She was given morphine and sublingual nitroglycerin with improvement in symptoms.  BNP levels were elevated and she was also given a dose of 40 mg IV Lasix  in the ER due to elevated BNP levels.   She was admitted back to the hospital from 07/21/2023 to 07/27/2023 due to chest pain, epigastric pain with radiation to her back.  Her lab work was remarkable for hyponatremia, AKI and CKD with a creatinine peaked to 7 poss related to recent contrast exposure.  She was given IV fluids and creatinine improved to 2.43.  A lot of her GDMT continued to be held at discharge including digoxin , furosemide , Imdur , losartan , spironolactone .   She was admitted to the hospital from 08/03/2023 to 08/14/2023 due to acute on chronic HFrEF exacerbation with 30 pound weight gain, hypotension, worsening HF symptoms.  Echocardiogram on/15/25 showed an LVEF of 15 to 20% with severe grade 3 diastolic dysfunction, reduced RV systolic function, moderate to severe TR, severe pulmonary hypertension with a severely dilated IVC.  Her admission weight was around 296 pounds and she required IV dobutamine, IV Lasix  with some metolazone dosing as well with significant improvement in diuresis and renal function.  Her carvedilol  was also temporarily held while optimizing volume status.  Her digoxin  was also discontinued due to her CKD and only taking twice weekly so no true benefit with twice weekly dosing she was transition to torsemide 40 mg twice daily.  She was also evaluated for dysphagia and discharged to home health.  Heart failure medications at discharge included losartan  25 mg daily, torsemide 40 mg twice daily, carvedilol  6.25 mg twice daily, hydralazine  25 mg twice daily but her Imdur  was held.    She was admitted to the hospital from 08/31/2023  to 09/15/2023 due to a heart failure exacerbation.  She was in the inpatient hospital unit for a couple of days and then admitted to the hospital medicine program at home going forward.  Was held due to AKI.  Imdur  and hydralazine  also held.  She did require IV diuretics during that admission and also some metolazone.  She followed up in the heart failure clinic and noted that all GDMT was actually on hold due to hypotension and hypokalemia. Palliative care consulted during that admission and patient was interested for palliative care to follow her at home.  She was admitted to the hospital from 10/23/2023 to 10/27/2023 due to altered mental status with acute encephalopathy in the setting of AKI, infection and ongoing use of gabapentin  at home.  She was experiencing several days of diarrhea at home and given IV fluids during that admission.  Her renal function improved with some IV fluid hydration.  Her GI PCR panel showed Salmonella species and urine culture also grew Salmonella in addition to E. coli.  Her torsemide dosing was changed from 40 mg twice daily to 40 mg daily.  When she followed up with nephrology in the office on 11/01/23, her metolazone dose was decreased at office visit to 3 times weekly due to concern for overdiuresis. She was  supposed to continue on Torsemide 40 mg BID.   She presented to the emergency room yesterday afternoon due to mild confusion over the past 2 weeks that worsened the past 2 days, generalized weakness, cough, difficulty sleeping.  She is also been experiencing left lower leg swelling x 1 week.  Still be taking Bactrim  for UTI. Initial lactate elevated at 2.60.  Head CT without acute intracranial abnormalities.  Left lower extremity DVT ultrasound negative. Initial lactate elevated at 2.60.  Initial chest x-ray is concerning for possible left lower lobe infiltrate based on elevated lactate and associated cough/tachycardia IV Rocephin was administered to cover for possible CAP.  Head CT without acute intracranial abnormalities.  Left lower extremity DVT ultrasound negative.  Her serum creatinine was also increased to 3.31 from a baseline of 1.7 with a BUN of 94 from a baseline in the 50s.  She was admitted to the hospital medicine service for altered mental status, fatigue secondary to hypovolemia, AKI/uremia likely also worsened with Bactrim  use. She was given gentle IV fluids with no significant change to her renal function. Nephrology has been consulted.   Cardiology evaluated her on 11/22/23 and agreed she was hypovolemic/dry and to hold diuretics temporarily. Unfortunately, her renal function did not improve with diuretic holiday and gentle IV fluids. With her poor prognosis, she was started on IV dobutamine with plans to transition to home hospice care. She had VT on dobutamine 5 mcg so this was decreased to 2.5. Her renal function has improved with initiation of dobutamine. She is going home on hospice with dobutamine ([facility]) with hospice managing her dobutamine going forward.   She is tired today with leg swelling but no significant SOB.    Medical History[1]  Family History[2]  Surgical History[3]  Allergies[4]  Medications:  Prior to Admission medications  Medication Sig Start Date End Date Taking? Authorizing Provider  acetaminophen (Tylenol Extra Strength) 500 mg tablet Take 500 mg by mouth daily as needed for mild pain (1-3).   Yes HISTORICAL PROVIDER, CONVERSION  albuterol 2.5 mg /3 mL (0.083 %) nebulizer solution Take 2.5 mg by nebulization every 6 (six) hours as needed for wheezing. 03/07/20  Yes HISTORICAL PROVIDER, CONVERSION  albuterol HFA (PROVENTIL HFA;VENTOLIN HFA;PROAIR HFA) 90 mcg/actuation inhaler Inhale 1 puff every 4 (four) hours as needed. 09/02/17  Yes HISTORICAL PROVIDER, CONVERSION  apixaban  (ELIQUIS ) 5 mg tab Take 5 mg by mouth 2 (two) times a day.   Yes HISTORICAL PROVIDER, CONVERSION  aspirin 81 mg chewable tablet Chew 81 mg daily.  08/12/21  Yes Elspeth Lorrene Kitten, MD  atorvastatin  (LIPITOR) 20 mg tablet Take 20 mg by mouth at bedtime.   Yes HISTORICAL PROVIDER, CONVERSION  budesonide  (PULMICORT ) 0.5 mg/2 mL nebulizer solution Take 0.5 mg by nebulization daily as needed. 03/07/20  Yes HISTORICAL PROVIDER, CONVERSION  cetirizine  (ZyrTEC ) 10 mg tablet Take 10 mg by mouth 2 (two) times a day. 08/25/17  Yes HISTORICAL PROVIDER, CONVERSION  cholecalciferol (VITAMIN D3) 1,000 unit (25 mcg) tablet Take 1,000 Units by mouth daily. 12/24/20  Yes HISTORICAL PROVIDER, CONVERSION  clonazePAM  (KlonoPIN ) 0.5 mg tablet Take 0.5 mg by mouth nightly as needed. 08/23/23  Yes HISTORICAL PROVIDER, CONVERSION  cyclobenzaprine  (FLEXERIL ) 5 mg tablet Take 1 tablet (5 mg total) by mouth 2 (two) times a day as needed for muscle spasms. 10/26/23 01/24/24 Yes Jyoti Risal, MD  digoxin  (LANOXIN ) 125 mcg (0.125 mg) tablet Take 125 mcg by mouth daily. 09/21/23  Yes HISTORICAL PROVIDER, CONVERSION  ezetimibe  (ZETIA ) 10 mg  tablet Take 10 mg by mouth Once Daily. Indications: excessive fat in the blood 07/15/20  Yes Muna Marine Foerster, DNP FNP  fluticasone propionate (FLONASE) 50 mcg/spray nasal spray Administer 2 sprays into affected nostril(s) daily.   Yes HISTORICAL PROVIDER, CONVERSION  gabapentin  (NEURONTIN ) 600 mg tablet Take 0.5 tablets (300 mg total) by mouth daily. Taking 300 mg bid 10/26/23 01/24/24 Yes Jyoti Risal, MD  insulin  glargine (Lantus  Solostar U-100 Insulin ) 100 unit/mL (3 mL) pen 18 units in the AM Patient taking differently: 20 units in the AM 09/27/23  Yes Sarah Floria Sciara, PA-C  insulin  lispro (HumaLOG) 100 unit/mL injection 0-8 units 09/20/23  Yes Brianna Dorothyann Boga, PA-C  metOLazone (ZAROXOLYN) 2.5 mg tablet Take 1 tablet (2.5 mg total) by mouth 3 (three) times a week. Take 30-60 mins before the torsemide dose 11/01/23  Yes Adetoye Lufadeju, MD  omeprazole  (PriLOSEC) 40 mg DR capsule Take 1 capsule by mouth daily. 12/23/18  Yes HISTORICAL  PROVIDER, CONVERSION  potassium chloride (KLOR-CON) 20 mEq ER tablet Take 1 tablet (20 mEq total) by mouth daily. 09/23/23  Yes Lynda Glade Lauth, PA-C  sulfamethoxazole -trimethoprim  (BACTRIM  DS) 800-160 mg per tablet Take 1 tablet by mouth 2 (two) times a day. 10/29/23  Yes HISTORICAL PROVIDER, CONVERSION  tirzepatide  (MOUNJARO ) 5 mg/0.5 mL subcutaneous pen injector Inject 5 mg under the skin once a week. Monday   Yes HISTORICAL PROVIDER, CONVERSION  torsemide (DEMADEX) 20 mg tablet Take 2 tablets (40 mg total) by mouth in the morning and 2 tablets (40 mg total) in the evening. Take demadex 40 mg daily for now- until followwed by PCP in 1 week. 10/26/23 01/24/24 Yes Wilnette Overman, MD  blood-glucose meter,receiver,continuous (FreeStyle [place] 3 Reader) by miscellaneous route. Use as directed with compatible sensors to monitor blood sugar levels.    HISTORICAL PROVIDER, CONVERSION  blood-glucose meter,receiver,continuous (FreeStyle Libre 3 Reader) by miscellaneous route. Use as directed with compatible sensors to monitor blood sugar levels.    HISTORICAL PROVIDER, CONVERSION  fluconazole  (DIFLUCAN ) 150 mg tablet Take 150 mg by mouth as needed. 10/29/23   HISTORICAL PROVIDER, CONVERSION  FreeStyle Libre 3 Plus Sensor APPLY 1 DISK TO CHECK GLUCOSE FOR 15 DAYS 08/27/23   HISTORICAL PROVIDER, CONVERSION  [Paused] hydrALAZINE  (APRESOLINE ) 25 mg tablet Take 25 mg by mouth 2 (two) times a day for 90 days. Indications: high blood pressure Patient not taking: No sig reported Wait to take this until your doctor or other care provider tells you to start again. 03/28/20   Muna Kumari Timsina, DNP FNP  [Paused] isosorbide  mononitrate (IMDUR ) 30 mg 24 hr tablet Take 1 tablet by mouth daily. Patient not taking: No sig reported Wait to take this until your doctor or other care provider tells you to start again. 11/09/22   HISTORICAL PROVIDER, CONVERSION  [Paused] losartan  (COZAAR ) 50 mg tablet Take 50 mg by mouth daily. Patient  not taking: No sig reported Wait to take this until your doctor or other care provider tells you to start again. 08/25/23   HISTORICAL PROVIDER, CONVERSION  [Paused] spironolactone  (ALDACTONE ) 50 mg tablet Take 1 tablet by mouth once daily Patient not taking: No sig reported Wait to take this until your doctor or other care provider tells you to start again. 05/24/23   Morene Quan Strag, PA-C  UNABLE TO FIND Take by mouth as needed (15 ml). Med Name: nyquil hbp    HISTORICAL PROVIDER, CONVERSION    Social History: Tobacco Use History[5] Social History   Substance and Sexual  Activity  Alcohol Use No   Social History   Substance and Sexual Activity  Drug Use No     Code Status: DNAR / Limited SOTO  Review of Systems: A complete ROS was performed with pertinent positives/negatives noted in the HPI. The remainder of the ROS are negative.   PhysicalExamination:   BP (!) 88/70 (BP Location: Left arm, Patient Position: Sitting)   Pulse 103   Temp 97.9 F (36.6 C) (Oral)   Resp 15   Ht 1.6 m (5' 3)   Wt 112 kg (245 lb 13 oz)   SpO2 97%   BMI 43.54 kg/m    General Appearance:  Obese AA female. No acute distress. Daughter at bedside.   HEENT:  Normocephalic, atraumatic. Oropharynx is moist. No icterus.    Lungs:   Respirations are unlabored. The lungs are clear in all fields bilaterally without rales, rhonchi or wheezing.   Heart: Heart sounds are regular. Heart rate is approximately 90-95 beats per minute.  1-2/6 systolic murmur at the LSB.    Extremities: 2+ pitting edema of the LE bilaterally. Cool to touch.    Neurologic:  The patient is awake, alert, and oriented to time, place, person and situation.      Objective Data Reviewed During this Patient Encounter:   Test Results  Labs:  No results for input(s): CKTOTAL, CKMB, TROPONIN in the last 72 hours. Results from last 7 days  Lab Units 11/29/23 0441  WHITE BLOOD CELL COUNT 10*3/uL 4.31*  RED  BLOOD CELL COUNT 10*6/uL 4.12  HEMOGLOBIN g/dL 88.7*  HEMATOCRIT % 66.2*  MEAN CORPUSCULAR VOLUME fL 81.8  MEAN CORPUSCULAR HEMOGLOBIN pg 27.2*  MEAN CORPUSCULAR HEMOGLOBIN CONC g/dL 66.6  RED CELL DISTRIBUTION WIDTH % 18.5*  PLATELET COUNT 10*3/uL 201  MEAN PLATELET VOLUME fL 7.8  . Results from last 7 days  Lab Units 11/29/23 0441 11/28/23 0350 11/27/23 0456  SODIUM mmol/L 135* 132* 129*  POTASSIUM mmol/L 4.1 4.0 4.1  CHLORIDE mmol/L 103 101 99  CO2 mmol/L 24 24 23   BUN mg/dL 60* 69* 80*  CREATININE mg/dL 8.40* 7.98* 7.48*   Lab Results  Component Value Date   HDL 26 (L) 11/17/2022   INR 2.6 (H) 08/10/2023   BNP 1,466 (H) 11/21/2023     Portions of this note were dictated using DRAGON voice recognition software. Please disregard any errors in transcription.           [1] Past Medical History: Diagnosis Date  . A-fib    (CMD)   . Asthma (CMD)   . CAD in native artery 03/07/2020  . Diabetes mellitus    (CMD)   . DVT (deep venous thrombosis)    (CMD)   . Hyperlipidemia   . Hypertension   [2] Family History Problem Relation Name Age of Onset  . Hypertension Mother    . Heart attack Mother    . Diabetes Father    . Stroke Father    . Hypertension Father    . Kidney failure Father         DIALYSIS  . Diabetes Sister    . Hypertension Sister    . Aneurysm Sister    . Stroke Sister    . Kidney failure Brother         DIALYSIS  . Diabetes Brother    . Hypertension Brother    . Heart failure Brother    . Lupus Neg Hx    . Rheum arthritis Neg Hx    .  Ankylosing spondylitis Neg Hx    . Psoriasis Neg Hx    . Gout Neg Hx    [3] Past Surgical History: Procedure Laterality Date  . CARDIAC DEFIBRILLATOR PLACEMENT     Procedure: CARDIAC DEFIBRILLATOR PLACEMENT  . CARDIAC ELECTROPHYSIOLOGY PROCEDURE N/A 04/22/2023   CV CHANGE GENERATOR IMPLANTABLE CARDIOVERTER DEFIBRILLATOR DUAL CHAMBER performed by Hildegard Rinks, MD at [facility]  . CESAREAN SECTION,  UNSPECIFIED     Procedure: CESAREAN SECTION; x3  . CORONARY ANGIOPLASTY WITH STENT PLACEMENT     Procedure: CORONARY ANGIOPLASTY WITH STENT PLACEMENT  . DILATION AND CURETTAGE, DIAGNOSTIC / THERAPEUTIC     Procedure: DILATION AND CURETTAGE, DIAGNOSTIC / THERAPEUTIC  . HERNIA REPAIR     Procedure: HERNIA REPAIR  . OTHER SURGICAL HISTORY Left 02/27/2022   Procedure: OTHER SURGICAL HISTORY (Excision of mass of left upper extremity); Dr.Lininiger;Surgeon  . TONSILLECTOMY     Procedure: TONSILLECTOMY  [4] Allergies Allergen Reactions  . Aspartame Other (See Comments)    Causes headaches  . Adhesive Tape-Silicones   . Allopurinol  Other (See Comments)  . Lisinopril Cough  . Meclizine Cough and Dizziness  . Metformin Other (See Comments)    Upset stomach  . Peanut Cough  . Penicillin Other (See Comments)    Reaction: Sweating (intolerance)   . Tree Nut Cough  . Aspirin GI Intolerance    Can take take coated aspirin only  [5] Social History Tobacco Use  Smoking Status Former  . Current packs/day: 0.00  . Types: Cigarettes  . Quit date: 14  . Years since quitting: 46.6  Smokeless Tobacco Never

## 2023-11-29 NOTE — Progress Notes (Signed)
 Case Management Discharge Note        CSN: [id number] DOB: 20-Jan-1956 Service: General Medicine Location: 456/01  Patient Class: Inpatient  DC Disposition: : Hospice - Home  Discharge DC Disposition: : Hospice - Home  Discharge Referrals Chosen geographical local area/county list shared with patient/family: Yes Date chosen geographical local area/county list shared with patient/family: 11/25/23 Post-Acute Provider Form Completed: Yes Case closed, patient/family agree with disposition plan: Yes  Pt's [place], daughter transported patient home by personal vehicle. Dobutamine is being managed by [facility]. Per [place], Dobutamine gtt and pump was switched to [facility]'s equipment prior to discharge, MD aware. Nurse with [facility] will meet pt at daughter's residence ([address] [place], [place]).       Case Management Coordination Status: Coordination Complete     Sabrina CHRISTELLA Platts, RN

## 2023-11-29 NOTE — Progress Notes (Signed)
 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  Subjective: Interval History: No acute events overnight. Feels slightly better.  Review of Systems: negative except as noted above  Objective:   Vital signs for last 24 hours: Temp:  [97.4 F (36.3 C)-98.6 F (37 C)] 97.4 F (36.3 C) Heart Rate:  [93-101] 96 Resp:  [18-19] 18 BP: (95-142)/(43-112) 137/85  Intake/Output last 24 hours:  Intake/Output Summary (Last 24 hours) at 11/29/2023 1018 Last data filed at 11/29/2023 0800 Gross per 24 hour  Intake 1218.19 ml  Output 1400 ml  Net -181.81 ml    Medications: Scheduled Meds: aspirin, 81 mg, oral, Daily budesonide , 0.5 mg, nebulization, RDaily [Held by provider] gabapentin , 300 mg, oral, Daily insulin  glargine, 18 Units, subcutaneous, QAM insulin  aspart (NovoLOG )/insulin  lispro (HumaLOG) injection (WF), 0-12 Units, subcutaneous, TID PC loratadine, 10 mg, oral, BID omeprazole , 40 mg, oral, Daily    PRN Meds: .  acetaminophen .  albuterol sulfate .  benzocaine-menthol .  benzonatate  .  calcium  carbonate .  dextrose .  dextrose .  guaiFENesin .  hydrOXYzine .  ondansetron  .  polyethylene glycol   Physical Exam: General appearance: Alert elderly woman sitting out of bed. On IV Dobutamine drip. Head: normocephalic, atraumatic Eyes: Sclera anicteric, no conjunctival pallor Lungs: Reduced breath sounds with few bibasilar rales. Heart: S1S2 present. Abdomen: Full, soft, no tenderness. BS +ve. Extremities: No cyanosis. Trace bilateral LE edema. Skin: Skin color, texture, turgor normal. No rashes or lesions Neurologic: alert and oriented times 3   Labs Recent Results (from the past 48 hours)  POC Glucose   Collection Time: 11/27/23 11:17 AM  Result Value Ref Range   Glucose, POC 243 (H) 70 - 99 mg/dL  POC Glucose   Collection Time: 11/27/23  3:28 PM  Result Value Ref Range   Glucose, POC 366 (H) 70 - 99 mg/dL  POC Glucose    Collection Time: 11/27/23  5:33 PM  Result Value Ref Range   Glucose, POC 360 (H) 70 - 99 mg/dL  POC Glucose   Collection Time: 11/27/23 10:39 PM  Result Value Ref Range   Glucose, POC 350 (H) 70 - 99 mg/dL  Phosphorus   Collection Time: 11/28/23  3:50 AM  Result Value Ref Range   Phosphorus 2.3 (L) 2.5 - 5.0 mg/dL  Magnesium   Collection Time: 11/28/23  3:50 AM  Result Value Ref Range   Magnesium 2.6 1.9 - 2.7 mg/dL  Basic Metabolic Panel   Collection Time: 11/28/23  3:50 AM  Result Value Ref Range   Sodium 132 (L) 136 - 145 mmol/L   Potassium 4.0 3.4 - 4.5 mmol/L   Chloride 101 98 - 107 mmol/L   CO2 24 21 - 31 mmol/L   Anion Gap 7 6 - 14 mmol/L   Glucose, Random 312 (H) 70 - 99 mg/dL   Blood Urea Nitrogen (BUN) 69 (H) 7 - 25 mg/dL   Creatinine 7.98 (H) 9.39 - 1.20 mg/dL   eGFR 27 (L) >40 fO/fpw/8.26f7   Calcium  8.4 (L) 8.6 - 10.3 mg/dL   BUN/Creatinine Ratio 34.3 (H) 10.0 - 20.0  CBC without Differential   Collection Time: 11/28/23  3:50 AM  Result Value Ref Range   WBC 4.35 (L) 4.40 - 11.00 10*3/uL   RBC 3.83 (L) 4.10 - 5.10 10*6/uL   Hemoglobin 10.4 (L) 12.3 - 15.3 g/dL   Hematocrit 68.2 (L) 64.0 - 44.6 %   Mean Corpuscular Volume (MCV) 82.7 80.0 -  96.0 fL   Mean Corpuscular Hemoglobin (MCH) 27.1 (L) 27.5 - 33.2 pg   Mean Corpuscular Hemoglobin Conc (MCHC) 32.7 (L) 33.0 - 37.0 g/dL   Red Cell Distribution Width (RDW) 18.7 (H) 12.3 - 17.0 %   Platelet Count (PLT) 199 150 - 450 10*3/uL   Mean Platelet Volume (MPV) 7.8 6.8 - 10.2 fL  POC Glucose   Collection Time: 11/28/23  7:39 AM  Result Value Ref Range   Glucose, POC 293 (H) 70 - 99 mg/dL  POC Glucose   Collection Time: 11/28/23 11:20 AM  Result Value Ref Range   Glucose, POC 267 (H) 70 - 99 mg/dL  POC Glucose   Collection Time: 11/28/23  4:32 PM  Result Value Ref Range   Glucose, POC 258 (H) 70 - 99 mg/dL  POC Glucose   Collection Time: 11/28/23  7:53 PM  Result Value Ref Range   Glucose, POC 208 (H) 70 -  99 mg/dL  Phosphorus   Collection Time: 11/29/23  4:41 AM  Result Value Ref Range   Phosphorus 2.3 (L) 2.5 - 5.0 mg/dL  Magnesium   Collection Time: 11/29/23  4:41 AM  Result Value Ref Range   Magnesium 2.5 1.9 - 2.7 mg/dL  Basic Metabolic Panel   Collection Time: 11/29/23  4:41 AM  Result Value Ref Range   Sodium 135 (L) 136 - 145 mmol/L   Potassium 4.1 3.4 - 4.5 mmol/L   Chloride 103 98 - 107 mmol/L   CO2 24 21 - 31 mmol/L   Anion Gap 8 6 - 14 mmol/L   Glucose, Random 236 (H) 70 - 99 mg/dL   Blood Urea Nitrogen (BUN) 60 (H) 7 - 25 mg/dL   Creatinine 8.40 (H) 9.39 - 1.20 mg/dL   eGFR 35 (L) >40 fO/fpw/8.26f7   Calcium  8.6 8.6 - 10.3 mg/dL   BUN/Creatinine Ratio 37.7 (H) 10.0 - 20.0  CBC without Differential   Collection Time: 11/29/23  4:41 AM  Result Value Ref Range   WBC 4.31 (L) 4.40 - 11.00 10*3/uL   RBC 4.12 4.10 - 5.10 10*6/uL   Hemoglobin 11.2 (L) 12.3 - 15.3 g/dL   Hematocrit 66.2 (L) 64.0 - 44.6 %   Mean Corpuscular Volume (MCV) 81.8 80.0 - 96.0 fL   Mean Corpuscular Hemoglobin (MCH) 27.2 (L) 27.5 - 33.2 pg   Mean Corpuscular Hemoglobin Conc (MCHC) 33.3 33.0 - 37.0 g/dL   Red Cell Distribution Width (RDW) 18.5 (H) 12.3 - 17.0 %   Platelet Count (PLT) 201 150 - 450 10*3/uL   Mean Platelet Volume (MPV) 7.8 6.8 - 10.2 fL  POC Glucose   Collection Time: 11/29/23  7:58 AM  Result Value Ref Range   Glucose, POC 287 (H) 70 - 99 mg/dL   Imaging.  CXR 11/24/2023. IMPRESSION: No active disease.   Aortic Atherosclerosis (ICD10-I70.0).   Renal sonogram 11/21/2023. IMPRESSION: Unremarkable renal ultrasound.   Assessment/Plan:  1) Nonoliguric AKI.     Likely from Bactrim  induced nephrotoxicity with dual diuretic therapy, Farxiga  and cardiorenal syndrome.     Peak BUN/Creat 94/ 3.2 mg/dl on 1/2/77974.     Resolving.     Continue Furosemide . 120 mg daily prn ordered.     Monitor BMP, I/O.   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     Baseline serum  creatinine 1.6-1.8 mg/dl.     Monitor labs.  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         To be weaned  of dobutamine drip.     Continue PO Furosemide .  4) Type 2 DM.      Monitor FSG.      Insulin  coverage.           LOS: 8 days    Eulas Manes MD, FACP 11/29/2023 10:18 AM

## 2023-12-20 DEATH — deceased

## 2023-12-30 ENCOUNTER — Ambulatory Visit: Payer: Self-pay | Admitting: Family Medicine

## 2024-01-11 ENCOUNTER — Encounter: Payer: Self-pay | Admitting: Family

## 2024-01-11 ENCOUNTER — Telehealth: Payer: Self-pay | Admitting: Family

## 2024-01-22 ENCOUNTER — Other Ambulatory Visit: Payer: Self-pay | Admitting: Family Medicine

## 2024-01-26 ENCOUNTER — Other Ambulatory Visit: Payer: Self-pay | Admitting: Family Medicine
